# Patient Record
Sex: Female | Born: 1950 | Race: White | Hispanic: No | Marital: Single | Smoking: Former smoker
Health system: Southern US, Community
[De-identification: ages and names within clinical notes are randomized; demographics above are authoritative.]

## PROBLEM LIST (undated history)

## (undated) DIAGNOSIS — Z720 Tobacco use: Secondary | ICD-10-CM

## (undated) DIAGNOSIS — F329 Major depressive disorder, single episode, unspecified: Secondary | ICD-10-CM

## (undated) DIAGNOSIS — E785 Hyperlipidemia, unspecified: Secondary | ICD-10-CM

## (undated) DIAGNOSIS — F32A Depression, unspecified: Secondary | ICD-10-CM

## (undated) DIAGNOSIS — Z8719 Personal history of other diseases of the digestive system: Secondary | ICD-10-CM

## (undated) DIAGNOSIS — I10 Essential (primary) hypertension: Secondary | ICD-10-CM

## (undated) DIAGNOSIS — I639 Cerebral infarction, unspecified: Secondary | ICD-10-CM

## (undated) DIAGNOSIS — G43909 Migraine, unspecified, not intractable, without status migrainosus: Secondary | ICD-10-CM

## (undated) HISTORY — DX: Personal history of other diseases of the digestive system: Z87.19

## (undated) HISTORY — PX: CHOLECYSTECTOMY: SHX55

## (undated) HISTORY — DX: Migraine, unspecified, not intractable, without status migrainosus: G43.909

## (undated) HISTORY — DX: Essential (primary) hypertension: I10

## (undated) HISTORY — PX: PARTIAL HYSTERECTOMY: SHX80

## (undated) HISTORY — PX: COLONOSCOPY: SHX174

## (undated) HISTORY — DX: Hyperlipidemia, unspecified: E78.5

## (undated) HISTORY — DX: Tobacco use: Z72.0

---

## 2003-12-15 HISTORY — PX: US ECHOCARDIOGRAPHY: HXRAD669

## 2005-07-09 ENCOUNTER — Encounter (INDEPENDENT_AMBULATORY_CARE_PROVIDER_SITE_OTHER): Payer: Self-pay | Admitting: Specialist

## 2005-07-10 ENCOUNTER — Ambulatory Visit (HOSPITAL_COMMUNITY): Admission: RE | Admit: 2005-07-10 | Discharge: 2005-07-10 | Payer: Self-pay | Admitting: General Surgery

## 2007-08-17 ENCOUNTER — Encounter: Payer: Self-pay | Admitting: Pulmonary Disease

## 2007-08-25 ENCOUNTER — Ambulatory Visit: Payer: Self-pay | Admitting: Pulmonary Disease

## 2007-09-07 ENCOUNTER — Telehealth: Payer: Self-pay | Admitting: Pulmonary Disease

## 2007-09-09 DIAGNOSIS — J209 Acute bronchitis, unspecified: Secondary | ICD-10-CM | POA: Insufficient documentation

## 2007-09-09 DIAGNOSIS — R079 Chest pain, unspecified: Secondary | ICD-10-CM | POA: Insufficient documentation

## 2007-09-09 DIAGNOSIS — R0602 Shortness of breath: Secondary | ICD-10-CM | POA: Insufficient documentation

## 2007-09-13 ENCOUNTER — Telehealth: Payer: Self-pay | Admitting: Pulmonary Disease

## 2009-08-10 HISTORY — PX: CARDIOVASCULAR STRESS TEST: SHX262

## 2010-02-01 ENCOUNTER — Ambulatory Visit: Payer: Self-pay | Admitting: Cardiology

## 2010-07-08 ENCOUNTER — Ambulatory Visit: Payer: Self-pay | Admitting: Cardiology

## 2010-09-16 ENCOUNTER — Other Ambulatory Visit: Payer: Self-pay | Admitting: Cardiology

## 2010-09-17 ENCOUNTER — Other Ambulatory Visit: Payer: Self-pay | Admitting: *Deleted

## 2010-09-17 MED ORDER — FLUTICASONE PROPIONATE 50 MCG/ACT NA SUSP: 2.0000 | Freq: Every day | NASAL | Status: DC

## 2010-10-25 NOTE — Op Note (Signed)
Patricia Norman, Patricia Norman            ACCOUNT NO.:  [id number]   MEDICAL RECORD NO.:  [id number]          PATIENT TYPE:  AMB   LOCATION:  DAY                          FACILITY:  [facility]   PHYSICIAN:  Sharlet Salina T. Hoxworth, M.D.DATE OF BIRTH:  [birth year]   DATE OF PROCEDURE:  07/09/2005  DATE OF DISCHARGE:                                 OPERATIVE REPORT   PREOPERATIVE DIAGNOSIS:  Cholelithiasis and cholecystitis.   POSTOPERATIVE DIAGNOSIS:  Cholelithiasis and cholecystitis.   SURGICAL PROCEDURE:  Laparoscopic cholecystectomy with intraoperative  cholangiogram.   SURGEON:  Dr. Johna Sheriff   ASSISTANT:  Dr. Jerelene Redden   ANESTHESIA:  General.   BRIEF HISTORY:  Patricia Norman is a 60 year old female with a 2-3 week history  of a persistent waxing waning right upper quadrant abdominal pain, nausea,  and occasional vomiting.  Earlier today, she had a gallbladder ultrasound  performed which showed a thickened gallbladder wall and multiple gallstones.  She has moderately elevated LFTs as well.  The common bile duct appears  normal on ultrasound.  I have recommended proceeding with laparoscopic  cholecystectomy with intraoperative cholangiogram.  The nature of the  procedure, its indications, risks of bleeding, infection, bile leak, bile  duct injury, cardiorespiratory complications were discussed understood.  She  is now brought to the operating room for this procedure.   DESCRIPTION OF OPERATION:  The patient brought to the operating room and  placed in a supine position on the operating table, and general orotracheal  anesthesia was induced.  She received preoperative antibiotics.  The abdomen  was widely sterilely prepped and draped.  Correct patient and procedure were  verified.  An open Roseanne Reno technique was used through a 1 cm infraumbilical  incision through a mattress sutures of 0 Vicryl and pneumoperitoneum  established.  Under direct vision, a 10 mm trocar was placed in the  subxiphoid area and two 5 mm trocars, 1 in the right subcostal margin.  The  gallbladder was thickened and somewhat edematous.  The fundus was grasped  and elevated up over the liver, the infundibulum retracted inferolaterally.  Fibrofatty tissue was stripped off the neck of the gallbladder toward the  porta hepatis, and peritoneum anterior and posterior to Calot's triangle was  incised.  The distal gallbladder and Calot's triangle was thoroughly  dissected.  The cystic duct gallbladder junction was dissected 360 degrees  and the cystic artery identified.  When the anatomy was clear, the cystic  duct was clipped at the gallbladder junction and operative cholangiogram was  obtained through the cystic duct.  This showed good filling of normal-sized  common bile duct and intrahepatic ducts with free flow into the duodenum and  no filling defects.  Following this, the cholangiocath was removed, and the  cystic duct was triply clipped proximally and divided.  The cystic artery  was double clipped proximally, clipped distally, and divided.  The  gallbladder was then dissected free from its bed using hook cautery and  removed through the umbilicus.  Complete hemostasis was ensured in the  operative site.  Trocars were removed under direct vision, all CO2  evacuated.  The mattress suture  was secured at the umbilicus.  Skin incisions were closed with interrupted  subcuticular 4-0 Monocryl and Steri-Strips.  Sponge, needle, and instrument  counts were correct. Dry sterile dressings were applied and the patient  taken to the recovery room in good condition.      Lorne Skeens. Hoxworth, M.D.  Electronically Signed     BTH/MEDQ  D:  07/09/2005  T:  07/10/2005  Job:  [id number]

## 2010-12-25 ENCOUNTER — Other Ambulatory Visit: Payer: Self-pay | Admitting: *Deleted

## 2010-12-25 DIAGNOSIS — F419 Anxiety disorder, unspecified: Secondary | ICD-10-CM

## 2010-12-25 MED ORDER — ALPRAZOLAM 0.5 MG PO TABS: 0.5000 mg | ORAL_TABLET | Freq: Every day | ORAL | Status: AC | PRN

## 2010-12-25 NOTE — Telephone Encounter (Signed)
Faxed signed RX

## 2011-02-07 ENCOUNTER — Encounter: Payer: Self-pay | Admitting: Cardiology

## 2011-02-18 ENCOUNTER — Other Ambulatory Visit: Payer: Self-pay | Admitting: Cardiology

## 2011-02-18 DIAGNOSIS — E785 Hyperlipidemia, unspecified: Secondary | ICD-10-CM

## 2011-02-18 DIAGNOSIS — I10 Essential (primary) hypertension: Secondary | ICD-10-CM

## 2011-02-21 ENCOUNTER — Encounter: Payer: Self-pay | Admitting: Cardiology

## 2011-02-21 ENCOUNTER — Other Ambulatory Visit (INDEPENDENT_AMBULATORY_CARE_PROVIDER_SITE_OTHER): Payer: BC Managed Care – PPO | Admitting: *Deleted

## 2011-02-21 ENCOUNTER — Ambulatory Visit (INDEPENDENT_AMBULATORY_CARE_PROVIDER_SITE_OTHER): Payer: BC Managed Care – PPO | Admitting: Cardiology

## 2011-02-21 VITALS — BP 130/70 | HR 76 | Ht <= 58 in | Wt 123.6 lb

## 2011-02-21 DIAGNOSIS — M3500 Sicca syndrome, unspecified: Secondary | ICD-10-CM

## 2011-02-21 DIAGNOSIS — Z7189 Other specified counseling: Secondary | ICD-10-CM

## 2011-02-21 DIAGNOSIS — I119 Hypertensive heart disease without heart failure: Secondary | ICD-10-CM | POA: Insufficient documentation

## 2011-02-21 DIAGNOSIS — K3189 Other diseases of stomach and duodenum: Secondary | ICD-10-CM

## 2011-02-21 DIAGNOSIS — E785 Hyperlipidemia, unspecified: Secondary | ICD-10-CM

## 2011-02-21 DIAGNOSIS — R1013 Epigastric pain: Secondary | ICD-10-CM

## 2011-02-21 DIAGNOSIS — I10 Essential (primary) hypertension: Secondary | ICD-10-CM

## 2011-02-21 DIAGNOSIS — E7800 Pure hypercholesterolemia, unspecified: Secondary | ICD-10-CM

## 2011-02-21 DIAGNOSIS — Z716 Tobacco abuse counseling: Secondary | ICD-10-CM

## 2011-02-21 LAB — BASIC METABOLIC PANEL
BUN: 12 mg/dL (ref 6–23)
CO2: 33 mEq/L — ABNORMAL HIGH (ref 19–32)
Calcium: 9.6 mg/dL (ref 8.4–10.5)
Chloride: 101 mEq/L (ref 96–112)
Creatinine, Ser: 0.8 mg/dL (ref 0.4–1.2)

## 2011-02-21 LAB — LIPID PANEL
Cholesterol: 184 mg/dL (ref 0–200)
Total CHOL/HDL Ratio: 3

## 2011-02-21 LAB — HEPATIC FUNCTION PANEL
ALT: 30 U/L (ref 0–35)
AST: 33 U/L (ref 0–37)
Alkaline Phosphatase: 56 U/L (ref 39–117)
Total Bilirubin: 0.7 mg/dL (ref 0.3–1.2)

## 2011-02-21 NOTE — Assessment & Plan Note (Signed)
The patient has a history of sicca syndrome.  Her ophthalmologist has placed her on vitamin E and vitamin A and fish oil twice a day for dry eyes.  Patient is also bothered with dry mouth.  She is postmenopausal.

## 2011-02-21 NOTE — Progress Notes (Signed)
Patricia Norman Date of Birth:  05-07-1951 [facility] [address]   [address] [place], [place]  [address] [phone]           Fax   [phone]  History of Present Illness: This pleasant 60 year old woman is seen for a scheduled six-month followup office visit.  He has a past history of essential hypertension and a past history of sicca syndrome and postmenopausal state.  She has ongoing cigarette usage and she has a history of dyslipidemia.  Since we last saw her she has seen her eye doctor who has started her on vitamin E and vitamin A. To try to help her dry eyes.  She is also on fish oil twice a day.  The patient does not have any history of ischemic heart disease although she has risk factors including family history and ongoing tobacco use, hypertension, and hyperlipidemia.  She had a one date treadmill Cardiolite stress test on 08/10/09 which showed normal perfusion and an ejection fraction of 67% patient had an echocardiogram 12/15/03 showed normal left ventricular systolic and diastolic function and mild mitral regurgitation and normal left atrial size  Current Outpatient Prescriptions  Medication Sig Dispense Refill  . ALPRAZolam (XANAX) 0.5 MG tablet Take 0.5 mg by mouth at bedtime as needed.        Marland Kitchen amLODipine (NORVASC) 10 MG tablet Take 10 mg by mouth daily.        Marland Kitchen aspirin-acetaminophen-caffeine (EXCEDRIN MIGRAINE) 250-250-65 MG per tablet Take 1 tablet by mouth every 6 (six) hours as needed.        . Calcium Carbonate (CALCIUM 500 PO) Take 1,000 mg by mouth daily.        . cyclobenzaprine (FLEXERIL) 10 MG tablet Take 10 mg by mouth 3 (three) times daily as needed.        . diphenhydrAMINE (BENADRYL ALLERGY) 25 mg capsule Take 25 mg by mouth every 6 (six) hours as needed.        Marland Kitchen estradiol (ESTRACE) 1 MG tablet Take 0.5 mg by mouth daily.        . fish oil-omega-3 fatty acids 1000 MG capsule Take 1 g by mouth daily.        . fluticasone  (FLONASE) 50 MCG/ACT nasal spray USE 1 SPRAY IN EACH NOSTRIL DAILY  1 Act  5  . hydrochlorothiazide (,MICROZIDE/HYDRODIURIL,) 12.5 MG capsule Take 12.5 mg by mouth daily.        Marland Kitchen loratadine (CLARITIN) 10 MG tablet Take 10 mg by mouth as needed.        . metoprolol succinate (TOPROL-XL) 25 MG 24 hr tablet Take 25 mg by mouth daily.        . Multiple Vitamin (MULTIVITAMIN) tablet Take 1 tablet by mouth daily.        Marland Kitchen omeprazole (PRILOSEC) 20 MG capsule Take 20 mg by mouth 2 (two) times daily.        Marland Kitchen oxybutynin (DITROPAN-XL) 10 MG 24 hr tablet Take 10 mg by mouth daily.        . sertraline (ZOLOFT) 50 MG tablet Take 50 mg by mouth daily.        . simvastatin (ZOCOR) 20 MG tablet Take 20 mg by mouth at bedtime.        Marland Kitchen VITAMIN E PO Take by mouth daily.          Allergies  Allergen Reactions  . Ace Inhibitors   . Chantix (Varenicline Tartrate)   .  Codeine   . Latex   . Spironolactone     Patient Active Problem List  Diagnoses  . ASTHMATIC BRONCHITIS, ACUTE  . DYSPNEA  . CHEST PAIN UNSPECIFIED    History  Smoking status  . Current Everyday Smoker -- 0.5 packs/day  Smokeless tobacco  . Not on file    History  Alcohol Use No    Family History  Problem Relation Age of Onset  . Hypertension Mother   . Angina Father     Review of Systems: Constitutional: no fever chills diaphoresis or fatigue or change in weight.  Head and neck: no hearing loss, no epistaxis, no photophobia or visual disturbance. Respiratory: No cough, shortness of breath or wheezing. Cardiovascular: No chest pain peripheral edema, palpitations. Gastrointestinal: No abdominal distention, no abdominal pain, no change in bowel habits hematochezia or melena. Genitourinary: No dysuria, no frequency, no urgency, no nocturia. Musculoskeletal:No arthralgias, no back pain, no gait disturbance or myalgias. Neurological: No dizziness, no headaches, no numbness, no seizures, no syncope, no weakness, no  tremors. Hematologic: No lymphadenopathy, no easy bruising. Psychiatric: No confusion, no hallucinations, no sleep disturbance.    Physical Exam: Filed Vitals:   02/21/11 0854  BP: 130/70  Pulse: 76  The general appearance reveals a well-developed well-nourished middle-aged woman in no distress.The head and neck exam reveals pupils equal and reactive.  Extraocular movements are full.  There is no scleral icterus.  The mouth and pharynx are normal.  The neck is supple.  The carotids reveal no bruits.  The jugular venous pressure is normal.  The  thyroid is not enlarged.  There is no lymphadenopathy.  The chest is clear to percussion and auscultation.  There are no rales or rhonchi.  Expansion of the chest is symmetrical.  The precordium is quiet.  The first heart sound is normal.  The second heart sound is physiologically split.  There is no murmur gallop rub or click.  There is no abnormal lift or heave.  The abdomen is soft and nontender.  The bowel sounds are normal.  The liver and spleen are not enlarged.  There are no abdominal masses.  There are no abdominal bruits.  Extremities reveal good pedal pulses.  There is no phlebitis or edema.  There is no cyanosis or clubbing.  Strength is normal and symmetrical in all extremities.  There is no lateralizing weakness.  There are no sensory deficits.  The skin is warm and dry.  There is no rash.     Assessment / Plan: Continue same medication.  Work harder on quitting smoking.  Recheck in 6 months for followup office visit and fasting lab work

## 2011-02-21 NOTE — Assessment & Plan Note (Signed)
The patient has not been having any symptoms referable to her blood pressure.  Blood pressure has been adequately controlled recently.  No headaches or dizzy spells or palpitations.

## 2011-02-21 NOTE — Assessment & Plan Note (Signed)
The patient continues to smoke between 5 and 7 cigarettes a day.  She's not having any productive cough or pulmonary symptoms at the present time.  We counseled her about the importance of stopping smoking.  Her boyfriend is also in the process of quitting.

## 2011-02-21 NOTE — Assessment & Plan Note (Signed)
The patient has a history of dyslipidemia.  She is on simvastatin 20 mg daily.  She is not having any side effects from his statin therapy.  Blood work is pending.  Continue on same medication and low-cholesterol diet.

## 2011-03-03 ENCOUNTER — Other Ambulatory Visit: Payer: Self-pay | Admitting: *Deleted

## 2011-03-03 DIAGNOSIS — I119 Hypertensive heart disease without heart failure: Secondary | ICD-10-CM

## 2011-03-03 MED ORDER — AMLODIPINE BESYLATE 10 MG PO TABS: 10.0000 mg | ORAL_TABLET | Freq: Every day | ORAL | Status: DC

## 2011-03-03 NOTE — Telephone Encounter (Signed)
Refilled meds per fax request.  

## 2011-03-04 ENCOUNTER — Telehealth: Payer: Self-pay | Admitting: *Deleted

## 2011-03-04 NOTE — Progress Notes (Signed)
Mailed copy, fiance in hospital

## 2011-03-04 NOTE — Telephone Encounter (Signed)
Mailed labs, fiance in hospital

## 2011-03-04 NOTE — Telephone Encounter (Signed)
Message copied by Burnell Blanks on Tue Mar 04, 2011 10:18 AM ------      Message from: Cassell Clement      Created: Sat Feb 22, 2011  4:00 PM       Please report.The liver function studies are normal.The LDL cholesterol is slightly high at 111.  Triglycerides are normal.  The blood sugar is borderline high at 100 and the potassium is low at 3.4.  Increase intake of banana and potassium rich foods.  Continue same medication.

## 2011-03-06 ENCOUNTER — Other Ambulatory Visit: Payer: Self-pay | Admitting: *Deleted

## 2011-03-06 DIAGNOSIS — I119 Hypertensive heart disease without heart failure: Secondary | ICD-10-CM

## 2011-03-06 MED ORDER — AMLODIPINE BESYLATE 10 MG PO TABS: 10.0000 mg | ORAL_TABLET | Freq: Every day | ORAL | Status: DC

## 2011-03-06 MED ORDER — METOPROLOL SUCCINATE ER 25 MG PO TB24: 25.0000 mg | ORAL_TABLET | Freq: Every day | ORAL | Status: DC

## 2011-03-06 NOTE — Telephone Encounter (Signed)
Refilled metoprolol and amlodipine

## 2011-04-10 ENCOUNTER — Other Ambulatory Visit: Payer: Self-pay | Admitting: *Deleted

## 2011-04-10 MED ORDER — SIMVASTATIN 20 MG PO TABS: 20.0000 mg | ORAL_TABLET | Freq: Every day | ORAL | Status: DC

## 2011-07-03 ENCOUNTER — Other Ambulatory Visit: Payer: Self-pay | Admitting: *Deleted

## 2011-07-03 MED ORDER — FLUTICASONE PROPIONATE 50 MCG/ACT NA SUSP: 1.0000 | Freq: Every day | NASAL | Status: DC

## 2011-07-03 NOTE — Telephone Encounter (Signed)
Refilled fluticasone.  

## 2011-07-28 ENCOUNTER — Other Ambulatory Visit: Payer: Self-pay | Admitting: *Deleted

## 2011-07-28 DIAGNOSIS — F419 Anxiety disorder, unspecified: Secondary | ICD-10-CM

## 2011-07-28 MED ORDER — ALPRAZOLAM 0.5 MG PO TABS: 0.5000 mg | ORAL_TABLET | Freq: Every evening | ORAL | Status: DC | PRN

## 2011-07-28 NOTE — Telephone Encounter (Signed)
Refill alprazolam one time,needs appt.

## 2011-09-04 ENCOUNTER — Other Ambulatory Visit: Payer: Self-pay | Admitting: *Deleted

## 2011-09-04 ENCOUNTER — Other Ambulatory Visit: Payer: Self-pay | Admitting: Cardiology

## 2011-09-04 ENCOUNTER — Telehealth: Payer: Self-pay | Admitting: Cardiology

## 2011-09-04 MED ORDER — POTASSIUM CHLORIDE ER 10 MEQ PO CPCR: ORAL_CAPSULE | ORAL | Status: DC

## 2011-09-04 NOTE — Telephone Encounter (Signed)
Pt needs kher potassium chloride called into [facility] in [place]

## 2011-09-04 NOTE — Telephone Encounter (Signed)
Refilled potassium,needs appointment

## 2011-09-09 NOTE — Telephone Encounter (Signed)
IT WAS REFILLED ON 09/04/11, AND PATIENT NEEDS TO SCHEDULE AN APPT FOR FURTHER REFILLS

## 2011-10-07 ENCOUNTER — Ambulatory Visit: Payer: BC Managed Care – PPO | Admitting: Cardiology

## 2011-10-24 ENCOUNTER — Other Ambulatory Visit: Payer: Self-pay | Admitting: Cardiology

## 2011-10-29 ENCOUNTER — Telehealth: Payer: Self-pay | Admitting: *Deleted

## 2011-10-29 MED ORDER — CYCLOBENZAPRINE HCL 10 MG PO TABS: 10.0000 mg | ORAL_TABLET | Freq: Three times a day (TID) | ORAL | Status: DC | PRN

## 2011-10-29 NOTE — Telephone Encounter (Signed)
Patricia Norman in hospice home and has about 2 weeks. Requested refill on Flexeril. Patient states she will call after things settle down.

## 2011-11-07 ENCOUNTER — Ambulatory Visit: Payer: BC Managed Care – PPO | Admitting: Cardiology

## 2011-12-17 ENCOUNTER — Telehealth: Payer: Self-pay | Admitting: Cardiology

## 2011-12-17 ENCOUNTER — Other Ambulatory Visit: Payer: Self-pay | Admitting: *Deleted

## 2011-12-17 DIAGNOSIS — I119 Hypertensive heart disease without heart failure: Secondary | ICD-10-CM

## 2011-12-17 MED ORDER — AMLODIPINE BESYLATE 10 MG PO TABS: 10.0000 mg | ORAL_TABLET | Freq: Every day | ORAL | Status: DC

## 2011-12-17 NOTE — Telephone Encounter (Signed)
Amlodipine 90 day supply refill needed asap, pt out, [facility] fax [facility]

## 2011-12-17 NOTE — Telephone Encounter (Signed)
90day supply sent.

## 2012-01-22 ENCOUNTER — Other Ambulatory Visit: Payer: Self-pay | Admitting: Cardiology

## 2012-01-22 DIAGNOSIS — F419 Anxiety disorder, unspecified: Secondary | ICD-10-CM

## 2012-01-22 DIAGNOSIS — E7800 Pure hypercholesterolemia, unspecified: Secondary | ICD-10-CM

## 2012-01-22 DIAGNOSIS — E876 Hypokalemia: Secondary | ICD-10-CM

## 2012-01-22 NOTE — Telephone Encounter (Signed)
New problem:  Patient calling, need lab work set up in the system prior to appt in sept.

## 2012-01-22 NOTE — Telephone Encounter (Signed)
Patient called to make sure she  is schedule for an O/V on 02/10/12 at 9:30 AM. Pt wants to make sure she also will have Blood work done the same day, because she is coming from [place]. Pt is aware that in the appointment note it said that pt will have labs with O/V. I  will send this message to [facility] Dr. Patty Sermons nurse desktop.

## 2012-01-23 MED ORDER — POTASSIUM CHLORIDE ER 10 MEQ PO CPCR: 10.0000 meq | ORAL_CAPSULE | ORAL | Status: DC

## 2012-01-23 MED ORDER — ALPRAZOLAM 0.5 MG PO TABS: 0.5000 mg | ORAL_TABLET | Freq: Every evening | ORAL | Status: DC | PRN

## 2012-01-23 NOTE — Telephone Encounter (Signed)
Scheduled labs.  Patient also requesting refills on her Xanax and K+, called to pharmacy

## 2012-02-10 ENCOUNTER — Encounter: Payer: Self-pay | Admitting: Cardiology

## 2012-02-10 ENCOUNTER — Ambulatory Visit (INDEPENDENT_AMBULATORY_CARE_PROVIDER_SITE_OTHER): Payer: BC Managed Care – PPO | Admitting: Cardiology

## 2012-02-10 VITALS — BP 137/82 | HR 72 | Ht <= 58 in | Wt 112.8 lb

## 2012-02-10 DIAGNOSIS — F411 Generalized anxiety disorder: Secondary | ICD-10-CM

## 2012-02-10 DIAGNOSIS — E7800 Pure hypercholesterolemia, unspecified: Secondary | ICD-10-CM

## 2012-02-10 DIAGNOSIS — M3500 Sicca syndrome, unspecified: Secondary | ICD-10-CM

## 2012-02-10 DIAGNOSIS — R0602 Shortness of breath: Secondary | ICD-10-CM

## 2012-02-10 DIAGNOSIS — E785 Hyperlipidemia, unspecified: Secondary | ICD-10-CM

## 2012-02-10 DIAGNOSIS — F419 Anxiety disorder, unspecified: Secondary | ICD-10-CM

## 2012-02-10 DIAGNOSIS — R079 Chest pain, unspecified: Secondary | ICD-10-CM

## 2012-02-10 LAB — HEPATIC FUNCTION PANEL
ALT: 19 U/L (ref 0–35)
Bilirubin, Direct: 0.1 mg/dL (ref 0.0–0.3)
Total Bilirubin: 0.4 mg/dL (ref 0.3–1.2)

## 2012-02-10 LAB — BASIC METABOLIC PANEL
BUN: 15 mg/dL (ref 6–23)
Creatinine, Ser: 0.8 mg/dL (ref 0.4–1.2)
GFR: 83.43 mL/min (ref >60.00)

## 2012-02-10 LAB — LIPID PANEL
Cholesterol: 160 mg/dL (ref 0–200)
LDL Cholesterol: 87 mg/dL (ref 0–99)
Total CHOL/HDL Ratio: 3

## 2012-02-10 MED ORDER — PANTOPRAZOLE SODIUM 40 MG PO TBEC: 40.0000 mg | DELAYED_RELEASE_TABLET | Freq: Every day | ORAL | Status: DC

## 2012-02-10 MED ORDER — CEVIMELINE HCL 30 MG PO CAPS: 30.0000 mg | ORAL_CAPSULE | Freq: Three times a day (TID) | ORAL | Status: DC

## 2012-02-10 MED ORDER — ALPRAZOLAM 0.5 MG PO TABS: 0.5000 mg | ORAL_TABLET | Freq: Two times a day (BID) | ORAL | Status: DC | PRN

## 2012-02-10 NOTE — Progress Notes (Signed)
Quick Note:  Please report to patient. The recent labs are stable. Continue same medication and careful diet.Potassium too low but she said at OV she had run out or forgot to take K tabs? CSD ______

## 2012-02-10 NOTE — Progress Notes (Signed)
Patricia Norman Date of Birth:  Mar 18, 1951 [facility] [address] [place], [place]  [address] [phone]         Fax   [phone]  History of Present Illness: This pleasant 61 year old woman is seen for a scheduled six-month followup office visit. He has a past history of essential hypertension and a past history of sicca syndrome and postmenopausal state. She has ongoing cigarette usage and she has a history of dyslipidemia. Since we last saw her she has seen her eye doctor who has started her on vitamin E and vitamin A. To try to help her dry eyes. She is also on fish oil twice a day. The patient does not have any history of ischemic heart disease although she has risk factors including family history and ongoing tobacco use, hypertension, and hyperlipidemia. She had a one date treadmill Cardiolite stress test on 08/10/09 which showed normal perfusion and an ejection fraction of 67% patient had an echocardiogram 12/15/03 showed normal left ventricular systolic and diastolic function and mild mitral regurgitation and normal left atrial size.  Since we last saw her she has been under a lot of stress.  Her common-law husband died of lung cancer with brain metastases in November 18, 2011.  This has been very stressful for her.  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  She now splits her time between living in her home in [place] and visiting her daughter who lives in [place].   Current Outpatient Prescriptions  Medication Sig Dispense Refill  . ALPRAZolam (XANAX) 0.5 MG tablet Take 1 tablet (0.5 mg total) by mouth 2 (two) times daily as needed.  180 tablet  1  . amLODipine (NORVASC) 10 MG tablet Take 1 tablet (10 mg total) by mouth daily.  90 tablet  3  . aspirin-acetaminophen-caffeine (EXCEDRIN MIGRAINE) 250-250-65 MG per tablet Take 1 tablet by mouth every 6 (six) hours as  needed.        . Calcium Carbonate (CALCIUM 500 PO) Take 1,000 mg by mouth daily.        . cyclobenzaprine (FLEXERIL) 10 MG tablet Take 1 tablet (10 mg total) by mouth 3 (three) times daily as needed.  90 tablet  0  . diphenhydrAMINE (BENADRYL ALLERGY) 25 mg capsule Take 25 mg by mouth every 6 (six) hours as needed.        Marland Kitchen estradiol (ESTRACE) 1 MG tablet Take 0.5 mg by mouth daily.        . fish oil-omega-3 fatty acids 1000 MG capsule Take 1 g by mouth daily.        . fluticasone (FLONASE) 50 MCG/ACT nasal spray Place 1 spray into the nose daily.  16 g  2  . hydrochlorothiazide (,MICROZIDE/HYDRODIURIL,) 12.5 MG capsule Take 12.5 mg by mouth daily.        Marland Kitchen loratadine (CLARITIN) 10 MG tablet Take 10 mg by mouth as needed.        . metoprolol succinate (TOPROL-XL) 25 MG 24 hr tablet Take 1 tablet (25 mg total) by mouth daily.  90 tablet  3  . Multiple Vitamin (MULTIVITAMIN) tablet Take 1 tablet by mouth daily.        Marland Kitchen omeprazole (PRILOSEC) 20 MG capsule Take 20 mg by mouth 2 (two) times daily.       Marland Kitchen oxybutynin (DITROPAN-XL) 10 MG 24 hr tablet Take 10  mg by mouth daily.        . potassium chloride (MICRO-K) 10 MEQ CR capsule Take 1 capsule (10 mEq total) by mouth as directed. Take 2 caps 3 times a day  540 capsule  1  . sertraline (ZOLOFT) 50 MG tablet TAKE 1 TABLET BY MOUTH EVERY DAY  90 tablet  0  . simvastatin (ZOCOR) 20 MG tablet Take 1 tablet (20 mg total) by mouth at bedtime.  90 tablet  3  . VITAMIN E PO Take by mouth daily.        . cevimeline (EVOXAC) 30 MG capsule Take 1 capsule (30 mg total) by mouth 3 (three) times daily.  90 capsule  1  . pantoprazole (PROTONIX) 40 MG tablet Take 1 tablet (40 mg total) by mouth daily.  90 tablet  3    Allergies  Allergen Reactions  . Ace Inhibitors   . Chantix (Varenicline Tartrate)   . Codeine   . Latex   . Spironolactone     Patient Active Problem List  Diagnosis  . ASTHMATIC BRONCHITIS, ACUTE  . DYSPNEA  . CHEST PAIN UNSPECIFIED    . Sicca syndrome  . Tobacco abuse counseling  . Benign hypertensive heart disease without heart failure  . Dyslipidemia    History  Smoking status  . Current Everyday Smoker -- 0.5 packs/day  Smokeless tobacco  . Not on file    History  Alcohol Use No    Family History  Problem Relation Age of Onset  . Hypertension Mother   . Angina Father     Review of Systems: Constitutional: no fever chills diaphoresis or fatigue or change in weight.  Head and neck: no hearing loss, no epistaxis, no photophobia or visual disturbance. Respiratory: No cough, shortness of breath or wheezing. Cardiovascular: No chest pain peripheral edema, palpitations. Gastrointestinal: No abdominal distention, no abdominal pain, no change in bowel habits hematochezia or melena. Genitourinary: No dysuria, no frequency, no urgency, no nocturia. Musculoskeletal:No arthralgias, no back pain, no gait disturbance or myalgias. Neurological: No dizziness, no headaches, no numbness, no seizures, no syncope, no weakness, no tremors. Hematologic: No lymphadenopathy, no easy bruising. Psychiatric: No confusion, no hallucinations, no sleep disturbance.    Physical Exam: Filed Vitals:   02/10/12 1010  BP: 137/82  Pulse: 72   the general appearance reveals a well-developed well-nourished woman in no distress.  She has lost 11 pounds since we last saw her.The head and neck exam reveals pupils equal and reactive.  Extraocular movements are full.  There is no scleral icterus.  The mouth and pharynx are normal.  The neck is supple.  The carotids reveal no bruits.  The jugular venous pressure is normal.  The  thyroid is not enlarged.  There is no lymphadenopathy.  The chest is clear to percussion and auscultation.  There are no rales or rhonchi.  Expansion of the chest is symmetrical.  The precordium is quiet.  The first heart sound is normal.  The second heart sound is physiologically split.  There is no murmur gallop rub  or click.  There is no abnormal lift or heave.  The abdomen is soft and nontender.  The bowel sounds are normal.  The liver and spleen are not enlarged.  There are no abdominal masses.  There are no abdominal bruits.  Extremities reveal good pedal pulses.  There is no phlebitis or edema.  There is no cyanosis or clubbing.  Strength is normal and symmetrical in all extremities.  There is no lateralizing weakness.  There are no sensory deficits.  The skin is warm and dry.  There is no rash.  There is a darkly pigmented small lesion on her left lower anterior chest and she will have her dermatologist check this.     Assessment / Plan: The patient is to continue same medication and we are adding Evoxac for sicca syndrome.  Recheck in 6 months for followup office visit lipid panel hepatic function panel and basal metabolic panel.

## 2012-02-10 NOTE — Assessment & Plan Note (Signed)
The patient has a history of hypercholesterolemia and is on simvastatin.  We're checking lab work today

## 2012-02-10 NOTE — Patient Instructions (Addendum)
Increase your Xanax to twice a day as needed  Start Protonix 40 mg daily  Start Evoxac 30 mg three times a day, call and let us know how this is working  Your physician wants you to follow-up in: 6 months with fasting labs (lp/bmet/hfp)  You will receive a reminder letter in the mail two months in advance. If you don't receive a letter, please call our office to schedule the follow-up appointment.   Will obtain labs today and call you with the results (lp/bmet/hfp)  .

## 2012-02-10 NOTE — Assessment & Plan Note (Signed)
The patient has not been having any recent chest pain.  She has had dyspepsia and is to start back on her Protonix which has helped her in the past.

## 2012-02-10 NOTE — Assessment & Plan Note (Signed)
The patient is having a terrible time with dry mouth.  She is not taking any antihistamines.  We talked about possible referral to rheumatologist but she did not want to do that right now.  We will give her a trial of Evoxac 30 mg 3 times a day.  We will give her a 2 week trial and if it works she will get it refilled for one month at a time.

## 2012-02-10 NOTE — Assessment & Plan Note (Signed)
Because of the stress over the past year the patient has not been able to quit smoking yet

## 2012-02-11 ENCOUNTER — Other Ambulatory Visit: Payer: BC Managed Care – PPO

## 2012-02-17 ENCOUNTER — Telehealth: Payer: Self-pay | Admitting: Cardiology

## 2012-02-17 NOTE — Telephone Encounter (Signed)
Advised patient of lab results  

## 2012-02-17 NOTE — Telephone Encounter (Signed)
rtn someone calls

## 2012-02-17 NOTE — Telephone Encounter (Signed)
Message copied by Burnell Blanks on Tue Feb 17, 2012  3:42 PM ------      Message from: Cassell Clement      Created: Tue Feb 10, 2012  9:11 PM       Please report to patient.  The recent labs are stable. Continue same medication and careful diet.Potassium too low but she said at OV she had run out or forgot to take K tabs?  CSD

## 2012-03-22 ENCOUNTER — Other Ambulatory Visit: Payer: Self-pay | Admitting: Cardiology

## 2012-03-22 ENCOUNTER — Telehealth: Payer: Self-pay | Admitting: Cardiology

## 2012-03-22 DIAGNOSIS — F329 Major depressive disorder, single episode, unspecified: Secondary | ICD-10-CM

## 2012-03-22 DIAGNOSIS — F32A Depression, unspecified: Secondary | ICD-10-CM

## 2012-03-22 DIAGNOSIS — T148XXA Other injury of unspecified body region, initial encounter: Secondary | ICD-10-CM

## 2012-03-22 DIAGNOSIS — I119 Hypertensive heart disease without heart failure: Secondary | ICD-10-CM

## 2012-03-22 NOTE — Telephone Encounter (Signed)
Mailed copy of labs as requested, advised patient

## 2012-03-22 NOTE — Telephone Encounter (Signed)
New Problem:    Patient called in wanting a copy of her blood work from her last visit to enroll in a medical plan.  Please call back.

## 2012-04-06 ENCOUNTER — Telehealth: Payer: Self-pay | Admitting: Cardiology

## 2012-04-06 NOTE — Telephone Encounter (Signed)
Pt calling re dr Patty Sermons seeing her daughter for high blood pressure, she has not seen him before

## 2012-04-07 NOTE — Telephone Encounter (Signed)
Left message to call back  

## 2012-04-30 ENCOUNTER — Other Ambulatory Visit: Payer: Self-pay | Admitting: *Deleted

## 2012-04-30 DIAGNOSIS — T148XXA Other injury of unspecified body region, initial encounter: Secondary | ICD-10-CM

## 2012-04-30 MED ORDER — CYCLOBENZAPRINE HCL 10 MG PO TABS: 10.0000 mg | ORAL_TABLET | Freq: Three times a day (TID) | ORAL | Status: DC | PRN

## 2012-05-25 ENCOUNTER — Other Ambulatory Visit: Payer: Self-pay | Admitting: *Deleted

## 2012-05-25 MED ORDER — FLUTICASONE PROPIONATE 50 MCG/ACT NA SUSP: 1.0000 | Freq: Every day | NASAL | Status: DC

## 2012-06-03 ENCOUNTER — Other Ambulatory Visit: Payer: Self-pay

## 2012-06-03 NOTE — Telephone Encounter (Signed)
Prior Auth required for Pantoprazole sod 40 mg future refills. [id number]. Start dte:05/13/2012-06/01/13.

## 2012-06-28 ENCOUNTER — Other Ambulatory Visit: Payer: Self-pay

## 2012-06-28 DIAGNOSIS — T148XXA Other injury of unspecified body region, initial encounter: Secondary | ICD-10-CM

## 2012-06-30 MED ORDER — CYCLOBENZAPRINE HCL 10 MG PO TABS: 10.0000 mg | ORAL_TABLET | Freq: Three times a day (TID) | ORAL | Status: DC | PRN

## 2012-07-02 ENCOUNTER — Other Ambulatory Visit: Payer: Self-pay

## 2012-07-02 MED ORDER — CEVIMELINE HCL 30 MG PO CAPS: 30.0000 mg | ORAL_CAPSULE | Freq: Three times a day (TID) | ORAL | Status: DC

## 2012-07-08 ENCOUNTER — Other Ambulatory Visit: Payer: Self-pay | Admitting: *Deleted

## 2012-07-08 MED ORDER — CEVIMELINE HCL 30 MG PO CAPS: 30.0000 mg | ORAL_CAPSULE | Freq: Three times a day (TID) | ORAL | Status: DC

## 2012-07-28 ENCOUNTER — Other Ambulatory Visit: Payer: Self-pay | Admitting: *Deleted

## 2012-07-28 MED ORDER — SIMVASTATIN 20 MG PO TABS: 20.0000 mg | ORAL_TABLET | Freq: Every day | ORAL | Status: DC

## 2012-09-27 ENCOUNTER — Other Ambulatory Visit: Payer: Self-pay | Admitting: *Deleted

## 2012-09-27 DIAGNOSIS — I119 Hypertensive heart disease without heart failure: Secondary | ICD-10-CM

## 2012-09-27 DIAGNOSIS — E876 Hypokalemia: Secondary | ICD-10-CM

## 2012-09-27 MED ORDER — AMLODIPINE BESYLATE 10 MG PO TABS: 10.0000 mg | ORAL_TABLET | Freq: Every day | ORAL | Status: DC

## 2012-09-27 MED ORDER — METOPROLOL SUCCINATE ER 25 MG PO TB24: 25.0000 mg | ORAL_TABLET | Freq: Every day | ORAL | Status: DC

## 2012-09-27 MED ORDER — POTASSIUM CHLORIDE ER 10 MEQ PO CPCR: 10.0000 meq | ORAL_CAPSULE | ORAL | Status: DC

## 2012-09-29 ENCOUNTER — Other Ambulatory Visit: Payer: Self-pay | Admitting: *Deleted

## 2012-09-29 DIAGNOSIS — E876 Hypokalemia: Secondary | ICD-10-CM

## 2012-09-29 MED ORDER — POTASSIUM CHLORIDE ER 10 MEQ PO CPCR: 10.0000 meq | ORAL_CAPSULE | ORAL | Status: DC

## 2012-10-01 ENCOUNTER — Other Ambulatory Visit: Payer: Self-pay

## 2012-10-01 DIAGNOSIS — E876 Hypokalemia: Secondary | ICD-10-CM

## 2012-10-05 ENCOUNTER — Other Ambulatory Visit: Payer: Self-pay | Admitting: Cardiology

## 2012-10-05 DIAGNOSIS — E876 Hypokalemia: Secondary | ICD-10-CM

## 2012-10-05 DIAGNOSIS — F419 Anxiety disorder, unspecified: Secondary | ICD-10-CM

## 2012-10-05 MED ORDER — POTASSIUM CHLORIDE ER 10 MEQ PO CPCR: 10.0000 meq | ORAL_CAPSULE | ORAL | Status: DC

## 2012-10-05 NOTE — Telephone Encounter (Deleted)
error 

## 2012-10-06 MED ORDER — ALPRAZOLAM 0.5 MG PO TABS: 0.5000 mg | ORAL_TABLET | Freq: Two times a day (BID) | ORAL | Status: DC | PRN

## 2012-11-04 ENCOUNTER — Other Ambulatory Visit: Payer: Self-pay | Admitting: *Deleted

## 2012-11-08 ENCOUNTER — Other Ambulatory Visit: Payer: Self-pay | Admitting: *Deleted

## 2012-11-08 MED ORDER — HYDROCHLOROTHIAZIDE 12.5 MG PO CAPS: 12.5000 mg | ORAL_CAPSULE | Freq: Every day | ORAL | Status: DC

## 2012-11-08 NOTE — Telephone Encounter (Signed)
[facility] HCT[facility] 25 [facility] daily, refilled per  Dr. Patty Sermons for 12.5 mg daily (listed for 12.5 mg daily in chart)

## 2012-12-01 ENCOUNTER — Other Ambulatory Visit: Payer: Self-pay | Admitting: Cardiology

## 2012-12-01 DIAGNOSIS — M62838 Other muscle spasm: Secondary | ICD-10-CM

## 2013-01-04 ENCOUNTER — Other Ambulatory Visit: Payer: Self-pay | Admitting: Cardiology

## 2013-02-03 ENCOUNTER — Other Ambulatory Visit: Payer: Self-pay | Admitting: Cardiology

## 2013-03-22 ENCOUNTER — Other Ambulatory Visit: Payer: Self-pay | Admitting: Cardiology

## 2013-03-26 ENCOUNTER — Other Ambulatory Visit: Payer: Self-pay | Admitting: Cardiology

## 2013-04-05 ENCOUNTER — Encounter: Payer: Self-pay | Admitting: Cardiology

## 2013-04-05 ENCOUNTER — Ambulatory Visit (INDEPENDENT_AMBULATORY_CARE_PROVIDER_SITE_OTHER): Payer: BC Managed Care – PPO | Admitting: Cardiology

## 2013-04-05 VITALS — BP 128/76 | HR 92 | Ht <= 58 in | Wt 128.0 lb

## 2013-04-05 DIAGNOSIS — Z716 Tobacco abuse counseling: Secondary | ICD-10-CM

## 2013-04-05 DIAGNOSIS — Z7189 Other specified counseling: Secondary | ICD-10-CM

## 2013-04-05 DIAGNOSIS — E7800 Pure hypercholesterolemia, unspecified: Secondary | ICD-10-CM

## 2013-04-05 DIAGNOSIS — R079 Chest pain, unspecified: Secondary | ICD-10-CM

## 2013-04-05 DIAGNOSIS — F17200 Nicotine dependence, unspecified, uncomplicated: Secondary | ICD-10-CM

## 2013-04-05 DIAGNOSIS — M3500 Sicca syndrome, unspecified: Secondary | ICD-10-CM

## 2013-04-05 DIAGNOSIS — I119 Hypertensive heart disease without heart failure: Secondary | ICD-10-CM

## 2013-04-05 LAB — BASIC METABOLIC PANEL
BUN: 15 mg/dL (ref 6–23)
CO2: 33 mEq/L — ABNORMAL HIGH (ref 19–32)
GFR: 68.22 mL/min (ref >60.00)
Glucose, Bld: 87 mg/dL (ref 70–99)
Potassium: 3 mEq/L — ABNORMAL LOW (ref 3.5–5.1)
Sodium: 141 mEq/L (ref 135–145)

## 2013-04-05 LAB — HEPATIC FUNCTION PANEL
ALT: 16 U/L (ref 0–35)
AST: 26 U/L (ref 0–37)
Albumin: 4.1 g/dL (ref 3.5–5.2)
Bilirubin, Direct: 0 mg/dL (ref 0.0–0.3)
Total Bilirubin: 0.4 mg/dL (ref 0.3–1.2)
Total Protein: 7.4 g/dL (ref 6.0–8.3)

## 2013-04-05 LAB — LIPID PANEL
Cholesterol: 167 mg/dL (ref 0–200)
HDL: 60.1 mg/dL (ref >39.00)
LDL Cholesterol: 90 mg/dL (ref 0–99)
Total CHOL/HDL Ratio: 3

## 2013-04-05 NOTE — Assessment & Plan Note (Signed)
The patient has not been expressing any recent chest pain or angina. 

## 2013-04-05 NOTE — Patient Instructions (Addendum)
Will obtain labs today and call you with the results (lp/bmet/hfp)  Your physician recommends that you continue on your current medications as directed. Please refer to the Current Medication list given to you today.  Your physician wants you to follow-up in: 6 months with fasting labs (lp/bmet/hfp) and EKG You will receive a reminder letter in the mail two months in advance. If you don't receive a letter, please call our office to schedule the follow-up appointment.   CALL us WITH CORRECT DOSE OF HYDROCHLOROTHIAZIDE SO WE CAN MAKE SURE OUR RECORDS ARE CORRECT

## 2013-04-05 NOTE — Progress Notes (Signed)
Patricia Norman Date of Birth:  27-Feb-1951 [address] [place], [place]  [address] [phone]         Fax   [phone]  History of Present Illness: This pleasant 62 year old woman is seen for a scheduled six-month followup office visit. He has a past history of essential hypertension and a past history of sicca syndrome and postmenopausal state. She has ongoing cigarette usage and she has a history of dyslipidemia.  The patient does not have any history of ischemic heart disease although she has risk factors including family history and ongoing tobacco use, hypertension, and hyperlipidemia. She had a one day treadmill Cardiolite stress test on 08/10/09 which showed normal perfusion and an ejection fraction of 67%.  The patient had an echocardiogram 12/15/03 showed normal left ventricular systolic and diastolic function and mild mitral regurgitation and normal left atrial size.  Since we last saw her she has been under a lot of stress.  Her common-law husband died of lung cancer with brain metastases in 16-Nov-2011.  This has been very stressful for her.  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  She now splits her time between living in her home in [place] and visiting her daughter who lives in [place].  She is now also the primary caregiver for her elderly parents who live up in [place].  When we last saw the patient she had lost a great deal of weight because of stress.  Since then her stress level has decreased and her weight is up 16 pounds.   Current Outpatient Prescriptions  Medication Sig Dispense Refill  . ALPRAZolam (XANAX) 0.5 MG tablet Take 1 tablet (0.5 mg total) by mouth 2 (two) times daily as needed.  180 tablet  1  . amLODipine (NORVASC) 10 MG tablet TAKE 1 TABLET BY MOUTH EVERY DAY  90 tablet  0  . aspirin-acetaminophen-caffeine (EXCEDRIN MIGRAINE)  250-250-65 MG per tablet Take 1 tablet by mouth every 6 (six) hours as needed.        . Calcium Carbonate (CALCIUM 500 PO) Take 1,000 mg by mouth daily.        . cevimeline (EVOXAC) 30 MG capsule TAKE ONE CAPSULE BY MOUTH 3 TIMES A DAY  90 capsule  3  . cyclobenzaprine (FLEXERIL) 10 MG tablet TAKE 1 TABLET BY MOUTH 3 TIMES A DAY AS NEEDED FOR MUSCLE SPASMS  90 tablet  1  . estradiol (ESTRACE) 1 MG tablet Take 1 mg by mouth daily.       . fluticasone (FLONASE) 50 MCG/ACT nasal spray Place 1 spray into the nose daily.  16 g  5  . hydrochlorothiazide (MICROZIDE) 12.5 MG capsule Take 25 mg by mouth daily.      Marland Kitchen loratadine (CLARITIN) 10 MG tablet Take 10 mg by mouth as needed.        . metoprolol succinate (TOPROL-XL) 25 MG 24 hr tablet TAKE 1 TABLET BY MOUTH EVERY DAY  90 tablet  0  . Multiple Vitamin (MULTIVITAMIN) tablet Take 1 tablet by mouth daily.        Marland Kitchen omeprazole (PRILOSEC) 20 MG capsule Take 20 mg by mouth 2 (two) times daily.       Marland Kitchen oxybutynin (DITROPAN-XL) 10 MG 24 hr tablet Take 10 mg by mouth daily.        . potassium chloride (MICRO-K) 10 MEQ  CR capsule TAKE 2 CAPSULES BY MOUTH 3 TIMES A DAY  180 capsule  0  . sertraline (ZOLOFT) 50 MG tablet TAKE 1 TABLET BY MOUTH EVERY DAY 100 MG      . simvastatin (ZOCOR) 20 MG tablet TAKE 1 TABLET (20 MG TOTAL) BY MOUTH AT BEDTIME.  90 tablet  0  . pantoprazole (PROTONIX) 40 MG tablet Take 1 tablet (40 mg total) by mouth daily.  90 tablet  3   No current facility-administered medications for this visit.    Allergies  Allergen Reactions  . Ace Inhibitors   . Chantix [Varenicline Tartrate]   . Codeine   . Latex   . Spironolactone     Patient Active Problem List   Diagnosis Date Noted  . Sicca syndrome 02/21/2011  . Tobacco abuse counseling 02/21/2011  . Benign hypertensive heart disease without heart failure 02/21/2011  . Dyslipidemia 02/21/2011  . ASTHMATIC BRONCHITIS, ACUTE 09/09/2007  . DYSPNEA 09/09/2007  . CHEST PAIN  UNSPECIFIED 09/09/2007    History  Smoking status  . Current Every Day Smoker -- 0.50 packs/day  Smokeless tobacco  . Not on file    History  Alcohol Use No    Family History  Problem Relation Age of Onset  . Hypertension Mother   . Angina Father     Review of Systems: Constitutional: no fever chills diaphoresis or fatigue or change in weight.  Head and neck: no hearing loss, no epistaxis, no photophobia or visual disturbance. Respiratory: No cough, shortness of breath or wheezing. Cardiovascular: No chest pain peripheral edema, palpitations. Gastrointestinal: No abdominal distention, no abdominal pain, no change in bowel habits hematochezia or melena. Genitourinary: No dysuria, no frequency, no urgency, no nocturia. Musculoskeletal:No arthralgias, no back pain, no gait disturbance or myalgias. Neurological: No dizziness, no headaches, no numbness, no seizures, no syncope, no weakness, no tremors. Hematologic: No lymphadenopathy, no easy bruising. Psychiatric: No confusion, no hallucinations, no sleep disturbance.    Physical Exam: Filed Vitals:   04/05/13 1108  BP: 128/76  Pulse: 92   the general appearance reveals a well-developed well-nourished woman in no distress.  She has lost 11 pounds since we last saw her.The head and neck exam reveals pupils equal and reactive.  Extraocular movements are full.  There is no scleral icterus.  The mouth and pharynx are normal.  The neck is supple.  The carotids reveal no bruits.  The jugular venous pressure is normal.  The  thyroid is not enlarged.  There is no lymphadenopathy.  The chest is clear to percussion and auscultation.  There are no rales or rhonchi.  Expansion of the chest is symmetrical.  The precordium is quiet.  The first heart sound is normal.  The second heart sound is physiologically split.  There is no murmur gallop rub or click.  There is no abnormal lift or heave.  The abdomen is soft and nontender.  The bowel sounds  are normal.  The liver and spleen are not enlarged.  There are no abdominal masses.  There are no abdominal bruits.  Extremities reveal good pedal pulses.  There is no phlebitis or edema.  There is no cyanosis or clubbing.  Strength is normal and symmetrical in all extremities.  There is no lateralizing weakness.  There are no sensory deficits.  The skin is warm and dry.  There is no rash.  There is a darkly pigmented small lesion on her left lower anterior chest and she will have her dermatologist check  this.     Assessment / Plan: Overall the patient has been doing well.  She is not sure what dose of HCTZ she is taking and she will call us back about that.  We are checking fasting lab work today.  Recheck in 6 months for office visit EKG lipid panel hepatic function panel and nasal metabolic panel.  She will try not to gain any more weight.

## 2013-04-05 NOTE — Assessment & Plan Note (Signed)
The patient continues to smoke against advice.  She has been able to stop for up to several weeks at a time but then under increased stress start smoking again.  She wants to quit because she watched her husband die of lung cancer.  She denies any cough or sputum production.

## 2013-04-05 NOTE — Assessment & Plan Note (Signed)
Blood pressure has been remaining stable on current medication.  She continues to have frequent stress headaches.  She takes Flexeril for her headaches.

## 2013-04-07 NOTE — Progress Notes (Signed)
Quick Note:  Please report to patient. The recent labs are stable. Continue same medication and careful diet. Lipids are normal. Potassium is still very low. Has she been taking her potassium pills as directed??? ______

## 2013-04-12 ENCOUNTER — Other Ambulatory Visit: Payer: Self-pay | Admitting: Cardiology

## 2013-04-13 ENCOUNTER — Encounter: Payer: Self-pay | Admitting: *Deleted

## 2013-05-02 ENCOUNTER — Other Ambulatory Visit: Payer: Self-pay | Admitting: Cardiology

## 2013-06-07 ENCOUNTER — Other Ambulatory Visit: Payer: Self-pay | Admitting: Cardiology

## 2013-07-05 ENCOUNTER — Other Ambulatory Visit: Payer: Self-pay | Admitting: Cardiology

## 2013-07-06 ENCOUNTER — Other Ambulatory Visit: Payer: Self-pay

## 2013-07-06 MED ORDER — AMLODIPINE BESYLATE 10 MG PO TABS: ORAL_TABLET | ORAL | Status: DC

## 2013-07-09 ENCOUNTER — Other Ambulatory Visit: Payer: Self-pay | Admitting: Cardiology

## 2013-09-01 ENCOUNTER — Other Ambulatory Visit: Payer: Self-pay | Admitting: Cardiology

## 2013-09-05 ENCOUNTER — Other Ambulatory Visit: Payer: Self-pay | Admitting: *Deleted

## 2013-09-05 MED ORDER — CEVIMELINE HCL 30 MG PO CAPS: 30.0000 mg | ORAL_CAPSULE | Freq: Three times a day (TID) | ORAL | Status: DC

## 2013-10-03 ENCOUNTER — Ambulatory Visit (INDEPENDENT_AMBULATORY_CARE_PROVIDER_SITE_OTHER): Payer: BC Managed Care – PPO | Admitting: Cardiology

## 2013-10-03 ENCOUNTER — Encounter: Payer: Self-pay | Admitting: Cardiology

## 2013-10-03 VITALS — BP 132/87 | HR 89 | Ht <= 58 in | Wt 117.8 lb

## 2013-10-03 DIAGNOSIS — E7800 Pure hypercholesterolemia, unspecified: Secondary | ICD-10-CM

## 2013-10-03 DIAGNOSIS — M3500 Sicca syndrome, unspecified: Secondary | ICD-10-CM

## 2013-10-03 DIAGNOSIS — F17200 Nicotine dependence, unspecified, uncomplicated: Secondary | ICD-10-CM

## 2013-10-03 DIAGNOSIS — I119 Hypertensive heart disease without heart failure: Secondary | ICD-10-CM

## 2013-10-03 DIAGNOSIS — Z716 Tobacco abuse counseling: Secondary | ICD-10-CM

## 2013-10-03 DIAGNOSIS — E876 Hypokalemia: Secondary | ICD-10-CM

## 2013-10-03 DIAGNOSIS — Z7189 Other specified counseling: Secondary | ICD-10-CM

## 2013-10-03 NOTE — Assessment & Plan Note (Signed)
The patient continues to smoke against advice.  She states she is trying to quit.  She has not been having a significant cough wheezing or dyspnea since last visit.

## 2013-10-03 NOTE — Assessment & Plan Note (Signed)
She is on hydrochlorothiazide.  She is taking 6 potassium pills per day.  If her potassium remains low we will need to work her up for possible hyperaldosteronism with an early morning plasma renin and and plasma aldosterone ratio.

## 2013-10-03 NOTE — Patient Instructions (Signed)
Will obtain labs today and call you with the results (lp/bmet/hfp)  Your physician wants you to follow-up in: 6 months with fasting labs (lp/bmet/hfp)  You will receive a reminder letter in the mail two months in advance. If you don't receive a letter, please call our office to schedule the follow-up appointment.   Your physician recommends that you continue on your current medications as directed. Please refer to the Current Medication list given to you today.   

## 2013-10-03 NOTE — Progress Notes (Signed)
Patricia Norman Date of Birth:  12/21/1950 [address] [place], [place]  [address] [phone]         Fax   [phone]  History of Present Illness: This pleasant 63 year old woman is seen for a scheduled six-month followup office visit. He has a past history of essential hypertension and a past history of sicca syndrome and postmenopausal state. She has ongoing cigarette usage and she has a history of dyslipidemia.  The patient does not have any history of ischemic heart disease although she has risk factors including family history and ongoing tobacco use, hypertension, and hyperlipidemia. She had a one day treadmill Cardiolite stress test on 08/10/09 which showed normal perfusion and an ejection fraction of 67%.  The patient had an echocardiogram 12/15/03 showed normal left ventricular systolic and diastolic function and mild mitral regurgitation and normal left atrial size.  Since we last saw her she has been under a lot of stress.  Her common-law husband died of lung cancer with brain metastases in May 2013.  This has been very stressful for her.  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  She now splits her time between living in her home in [place] and visiting her daughter who lives in [place].  She is now also the primary caregiver for her elderly parents who live up in [place].  She has a past history of hypokalemia.  We are rechecking labs today.  She has lost 11 pounds since last visit.  She has had some intermittent diarrhea.   Current Outpatient Prescriptions  Medication Sig Dispense Refill  . ALPRAZolam (XANAX) 0.5 MG tablet TAKE 1 TABLET BY MOUTH TWICE A DAY AS NEEDED  180 tablet  1  . amLODipine (NORVASC) 10 MG tablet TAKE 1 TABLET BY MOUTH EVERY DAY  90 tablet  0  . aspirin-acetaminophen-caffeine (EXCEDRIN MIGRAINE) 250-250-65 MG per tablet Take 1  tablet by mouth every 6 (six) hours as needed.        . Calcium Carbonate (CALCIUM 500 PO) Take 1,000 mg by mouth daily.        . cevimeline (EVOXAC) 30 MG capsule Take 1 capsule (30 mg total) by mouth 3 (three) times daily.  90 capsule  1  . Cyanocobalamin (VITAMIN B 12 PO) Take by mouth daily.      . cyclobenzaprine (FLEXERIL) 10 MG tablet TAKE 1 TABLET BY MOUTH 3 TIMES A DAY AS NEEDED FOR MUSCLE SPASMS  90 tablet  3  . estradiol (ESTRACE) 1 MG tablet Take 1 mg by mouth daily.       . fluticasone (FLONASE) 50 MCG/ACT nasal spray Place 1 spray into the nose daily.  16 g  5  . hydrochlorothiazide (MICROZIDE) 12.5 MG capsule Take 25 mg by mouth daily.      Marland Kitchen. loratadine (CLARITIN) 10 MG tablet Take 10 mg by mouth as needed.        . Melatonin 5 MG TABS Take by mouth at bedtime as needed.      . metoprolol succinate (TOPROL-XL) 25 MG 24 hr tablet TAKE 1 TABLET BY MOUTH EVERY DAY  90 tablet  0  . Multiple Vitamin (MULTIVITAMIN) tablet Take 1 tablet by mouth daily.        Marland Kitchen. omeprazole (PRILOSEC) 20 MG capsule Take 20 mg by mouth 2 (two) times daily.       .Marland Kitchen  oxybutynin (DITROPAN-XL) 10 MG 24 hr tablet Take 10 mg by mouth daily.        . potassium chloride (MICRO-K) 10 MEQ CR capsule TAKE 2 CAPSULES BY MOUTH 3 TIMES A DAY  180 capsule  1  . Probiotic Product (PROBIOTIC DAILY PO) Take by mouth daily.      . sertraline (ZOLOFT) 50 MG tablet TAKE 1 TABLET BY MOUTH EVERY DAY 100 MG      . simvastatin (ZOCOR) 20 MG tablet TAKE 1 TABLET BY MOUTH AT BEDTIME **CALL OFFICE FOR FOLLOW UP APPT**  90 tablet  1  . traZODone (DESYREL) 50 MG tablet        No current facility-administered medications for this visit.    Allergies  Allergen Reactions  . Ace Inhibitors   . Chantix [Varenicline Tartrate]   . Codeine   . Latex   . Spironolactone     Patient Active Problem List   Diagnosis Date Noted  . Sicca syndrome 02/21/2011  . Tobacco abuse counseling 02/21/2011  . Benign hypertensive heart disease  without heart failure 02/21/2011  . Dyslipidemia 02/21/2011  . ASTHMATIC BRONCHITIS, ACUTE 09/09/2007  . DYSPNEA 09/09/2007  . CHEST PAIN UNSPECIFIED 09/09/2007    History  Smoking status  . Current Every Day Smoker -- 0.50 packs/day  Smokeless tobacco  . Not on file    History  Alcohol Use No    Family History  Problem Relation Age of Onset  . Hypertension Mother   . Angina Father     Review of Systems: Constitutional: no fever chills diaphoresis or fatigue or change in weight.  Head and neck: no hearing loss, no epistaxis, no photophobia or visual disturbance. Respiratory: No cough, shortness of breath or wheezing. Cardiovascular: No chest pain peripheral edema, palpitations. Gastrointestinal: No abdominal distention, no abdominal pain, no change in bowel habits hematochezia or melena. Genitourinary: No dysuria, no frequency, no urgency, no nocturia. Musculoskeletal:No arthralgias, no back pain, no gait disturbance or myalgias. Neurological: No dizziness, no headaches, no numbness, no seizures, no syncope, no weakness, no tremors. Hematologic: No lymphadenopathy, no easy bruising. Psychiatric: No confusion, no hallucinations, no sleep disturbance.    Physical Exam: Filed Vitals:   10/03/13 1512  BP: 132/87  Pulse: 89   the general appearance reveals a well-developed well-nourished woman in no distress.  She has lost 11 pounds since we last saw her.The head and neck exam reveals pupils equal and reactive.  Extraocular movements are full.  There is no scleral icterus.  The mouth and pharynx are normal.  The neck is supple.  The carotids reveal no bruits.  The jugular venous pressure is normal.  The  thyroid is not enlarged.  There is no lymphadenopathy.  The chest is clear to percussion and auscultation.  There are no rales or rhonchi.  Expansion of the chest is symmetrical.  The precordium is quiet.  The first heart sound is normal.  The second heart sound is  physiologically split.  There is no murmur gallop rub or click.  There is no abnormal lift or heave.  The abdomen is soft and nontender.  The bowel sounds are normal.  The liver and spleen are not enlarged.  There are no abdominal masses.  There are no abdominal bruits.  Extremities reveal good pedal pulses.  There is no phlebitis or edema.  There is no cyanosis or clubbing.  Strength is normal and symmetrical in all extremities.  There is no lateralizing weakness.  There are no  sensory deficits.  The skin is warm and dry.  There is no rash.  There is a darkly pigmented small lesion on her left lower anterior chest and she will have her dermatologist check this.  EKG shows normal sinus rhythm and is within normal limits.  No EKG changes of hypokalemia today.   Assessment / Plan: Overall the patient has been doing well.  .  We are checking fasting lab work today.  Recheck in 6 months for office visit EKG lipid panel hepatic function panel and basal metabolic panel.

## 2013-10-03 NOTE — Assessment & Plan Note (Signed)
Her blood pressure has been remaining stable on current therapy.  No dizziness or syncope.  No symptoms of CHF.

## 2013-10-04 ENCOUNTER — Encounter: Payer: Self-pay | Admitting: *Deleted

## 2013-10-04 LAB — BASIC METABOLIC PANEL
BUN: 12 mg/dL (ref 6–23)
CHLORIDE: 102 meq/L (ref 96–112)
CO2: 29 mEq/L (ref 19–32)
Calcium: 9.5 mg/dL (ref 8.4–10.5)
Creatinine, Ser: 0.8 mg/dL (ref 0.4–1.2)
GFR: 72.81 mL/min (ref >60.00)
Glucose, Bld: 95 mg/dL (ref 70–99)
Potassium: 2.9 mEq/L — ABNORMAL LOW (ref 3.5–5.1)
SODIUM: 141 meq/L (ref 135–145)

## 2013-10-04 LAB — LIPID PANEL
Cholesterol: 178 mg/dL (ref 0–200)
HDL: 47.6 mg/dL (ref >39.00)
LDL CALC: 105 mg/dL — AB (ref 0–99)
TRIGLYCERIDES: 127 mg/dL (ref 0.0–149.0)
Total CHOL/HDL Ratio: 4
VLDL: 25.4 mg/dL (ref 0.0–40.0)

## 2013-10-04 LAB — HEPATIC FUNCTION PANEL
ALBUMIN: 4.1 g/dL (ref 3.5–5.2)
ALK PHOS: 42 U/L (ref 39–117)
ALT: 17 U/L (ref 0–35)
AST: 29 U/L (ref 0–37)
Bilirubin, Direct: 0.1 mg/dL (ref 0.0–0.3)
Total Bilirubin: 0.4 mg/dL (ref 0.3–1.2)
Total Protein: 7.3 g/dL (ref 6.0–8.3)

## 2013-10-10 ENCOUNTER — Telehealth: Payer: Self-pay | Admitting: *Deleted

## 2013-10-10 DIAGNOSIS — E876 Hypokalemia: Secondary | ICD-10-CM

## 2013-10-10 NOTE — Telephone Encounter (Signed)
Advised patient and scheduled lab appointment

## 2013-10-10 NOTE — Telephone Encounter (Signed)
Message copied by Burnell BlanksPRATT, Elisha Mcgruder B on Mon Oct 10, 2013  4:43 PM ------      Message from: Cassell ClementBRACKBILL, THOMAS      Created: Wed Oct 05, 2013  7:51 AM       Please report.  Lipids are good.  Liver tests are good.  Her potassium remains very low.  Continue Kdur.  Have her return for a early morning plasma renin and and plasma aldosterone ratio. ------

## 2013-10-11 ENCOUNTER — Other Ambulatory Visit: Payer: Self-pay | Admitting: *Deleted

## 2013-10-11 ENCOUNTER — Other Ambulatory Visit (INDEPENDENT_AMBULATORY_CARE_PROVIDER_SITE_OTHER): Payer: BC Managed Care – PPO

## 2013-10-11 ENCOUNTER — Telehealth: Payer: Self-pay | Admitting: *Deleted

## 2013-10-11 DIAGNOSIS — E876 Hypokalemia: Secondary | ICD-10-CM

## 2013-10-11 DIAGNOSIS — E7800 Pure hypercholesterolemia, unspecified: Secondary | ICD-10-CM

## 2013-10-11 LAB — LIPID PANEL
CHOL/HDL RATIO: 3
Cholesterol: 169 mg/dL (ref 0–200)
HDL: 56 mg/dL (ref >39.00)
LDL Cholesterol: 102 mg/dL — ABNORMAL HIGH (ref 0–99)
TRIGLYCERIDES: 55 mg/dL (ref 0.0–149.0)
VLDL: 11 mg/dL (ref 0.0–40.0)

## 2013-10-11 LAB — HEPATIC FUNCTION PANEL
ALBUMIN: 4.1 g/dL (ref 3.5–5.2)
ALT: 15 U/L (ref 0–35)
AST: 28 U/L (ref 0–37)
Alkaline Phosphatase: 41 U/L (ref 39–117)
Bilirubin, Direct: 0.1 mg/dL (ref 0.0–0.3)
Total Bilirubin: 0.3 mg/dL (ref 0.2–1.2)
Total Protein: 7.4 g/dL (ref 6.0–8.3)

## 2013-10-11 LAB — BASIC METABOLIC PANEL
BUN: 15 mg/dL (ref 6–23)
CHLORIDE: 102 meq/L (ref 96–112)
CO2: 25 meq/L (ref 19–32)
Calcium: 9 mg/dL (ref 8.4–10.5)
Creatinine, Ser: 0.9 mg/dL (ref 0.4–1.2)
GFR: 68.1 mL/min (ref >60.00)
Glucose, Bld: 86 mg/dL (ref 70–99)
POTASSIUM: 3.2 meq/L — AB (ref 3.5–5.1)
SODIUM: 138 meq/L (ref 135–145)

## 2013-10-11 MED ORDER — OMEPRAZOLE 20 MG PO CPDR: 20.0000 mg | DELAYED_RELEASE_CAPSULE | Freq: Every day | ORAL | Status: DC

## 2013-10-11 MED ORDER — SERTRALINE HCL 100 MG PO TABS: 150.0000 mg | ORAL_TABLET | Freq: Every day | ORAL | Status: DC

## 2013-10-11 MED ORDER — POTASSIUM CHLORIDE ER 10 MEQ PO CPCR: 10.0000 meq | ORAL_CAPSULE | Freq: Every day | ORAL | Status: DC

## 2013-10-11 MED ORDER — TRAZODONE HCL 50 MG PO TABS: 50.0000 mg | ORAL_TABLET | Freq: Every day | ORAL | Status: AC

## 2013-10-11 NOTE — Telephone Encounter (Signed)
Patient walked in today to t/w Regis BillMelinda Pratt, LPN, had several questions we addressed needed Potassium refill ( 10 meq ) daily sent into [facility] [place].  Had question about HCTZ is only taking (12.5 mg ) daily and AVS stated (25 mg ) daily, will have to be called back, Omperazole ( 20 mg ) daily, Zoloft instead of ( 50 mg ) pt is taking (150 mg ) daily and Vit D is 1000 daily can't seem to add that to list. Will route this message to [place]. I fixed list except Vit D and needs to address the HCTZ

## 2013-10-12 ENCOUNTER — Other Ambulatory Visit: Payer: Self-pay | Admitting: Cardiology

## 2013-10-13 NOTE — Progress Notes (Signed)
Quick Note:  Please report to patient. The recent labs are stable. Continue same medication and careful diet. The potassium is still low. The lipids are stable. The liver tests are normal. We're still waiting for the aldosterone and Renin levels ______

## 2013-10-14 LAB — ALDOSTERONE: ALDOSTERONE, SERUM: 18 ng/dL

## 2013-10-14 NOTE — Telephone Encounter (Signed)
Notes Recorded by Burnell BlanksMelinda B Remigio Mcmillon on 10/13/2013 at 5:47 PM Left message to call back

## 2013-10-17 LAB — RENIN: Renin Activity: 2.15 ng/mL/h (ref 0.25–5.82)

## 2013-10-24 ENCOUNTER — Telehealth: Payer: Self-pay | Admitting: *Deleted

## 2013-10-24 DIAGNOSIS — E876 Hypokalemia: Secondary | ICD-10-CM

## 2013-10-24 MED ORDER — POTASSIUM CHLORIDE ER 10 MEQ PO CPCR: ORAL_CAPSULE | ORAL | Status: DC

## 2013-10-24 NOTE — Telephone Encounter (Signed)
Message copied by Burnell BlanksPRATT, Eshan Trupiano B on Mon Oct 24, 2013  5:37 PM ------      Message from: Cassell ClementBRACKBILL, THOMAS      Created: Tue Oct 18, 2013  7:30 AM       Please report.  The aldosterone and renin levels are normal.  To keep her potassium where it should be,, it is important for her to take the potassium tablets as prescribed. ------

## 2013-10-24 NOTE — Telephone Encounter (Signed)
Message copied by Burnell BlanksPRATT, Saira Kramme B on Mon Oct 24, 2013  5:29 PM ------      Message from: Cassell ClementBRACKBILL, THOMAS      Created: Fri Oct 14, 2013 12:51 PM       Aldosterone level is normal.  The renin level is not yet back ------

## 2013-10-24 NOTE — Telephone Encounter (Signed)
Advised patient of labs.  Notes Recorded by Burnell BlanksMelinda B Pratt on 10/20/2013 at 2:51 PM Left message to call back Notes Recorded by Cassell Clementhomas Brackbill, MD on 10/18/2013 at 7:30 AM Please report. The aldosterone and renin levels are normal. To keep her potassium where it should be,, it is important for her to take the potassium tablets as prescribed. Notes Recorded by Cassell Clementhomas Brackbill, MD on 10/14/2013 at 12:51 PM Aldosterone level is normal. The renin level is not yet back Notes Recorded by Burnell BlanksMelinda B Pratt on 10/13/2013 at 5:47 PM Left message to call back Notes Recorded by Loa SocksIvy M Martin, LPN on 1/6/10965/10/2013 at 5:53 PM Preliminarily reviewed. Forwarded to MD desktop for review and signature

## 2013-10-25 NOTE — Telephone Encounter (Signed)
Went over medications with patient and she is taking K+ 3 tablets twice a day

## 2013-10-27 ENCOUNTER — Other Ambulatory Visit: Payer: Self-pay | Admitting: Cardiology

## 2013-10-28 ENCOUNTER — Other Ambulatory Visit: Payer: Self-pay | Admitting: Cardiology

## 2013-10-28 DIAGNOSIS — F419 Anxiety disorder, unspecified: Secondary | ICD-10-CM

## 2013-12-26 ENCOUNTER — Other Ambulatory Visit: Payer: Self-pay | Admitting: Cardiology

## 2013-12-26 DIAGNOSIS — T7840XS Allergy, unspecified, sequela: Secondary | ICD-10-CM

## 2013-12-26 DIAGNOSIS — M3500 Sicca syndrome, unspecified: Secondary | ICD-10-CM

## 2014-01-18 ENCOUNTER — Other Ambulatory Visit: Payer: Self-pay | Admitting: Cardiology

## 2014-03-31 ENCOUNTER — Other Ambulatory Visit: Payer: Self-pay | Admitting: Cardiology

## 2014-03-31 DIAGNOSIS — M545 Low back pain, unspecified: Secondary | ICD-10-CM

## 2014-03-31 DIAGNOSIS — M3500 Sicca syndrome, unspecified: Secondary | ICD-10-CM

## 2014-05-02 ENCOUNTER — Other Ambulatory Visit: Payer: Self-pay | Admitting: Cardiology

## 2014-05-14 ENCOUNTER — Other Ambulatory Visit: Payer: Self-pay | Admitting: Cardiology

## 2014-07-03 ENCOUNTER — Other Ambulatory Visit: Payer: Self-pay | Admitting: Cardiology

## 2014-07-19 ENCOUNTER — Ambulatory Visit: Payer: Self-pay | Admitting: Cardiology

## 2014-07-20 ENCOUNTER — Ambulatory Visit (INDEPENDENT_AMBULATORY_CARE_PROVIDER_SITE_OTHER): Payer: BC Managed Care – PPO | Admitting: Cardiology

## 2014-07-20 ENCOUNTER — Encounter: Payer: Self-pay | Admitting: Cardiology

## 2014-07-20 VITALS — BP 124/86 | HR 75 | Ht <= 58 in | Wt 118.8 lb

## 2014-07-20 DIAGNOSIS — I119 Hypertensive heart disease without heart failure: Secondary | ICD-10-CM

## 2014-07-20 LAB — BASIC METABOLIC PANEL
BUN: 16 mg/dL (ref 6–23)
CALCIUM: 9.5 mg/dL (ref 8.4–10.5)
CO2: 30 mEq/L (ref 19–32)
Chloride: 105 mEq/L (ref 96–112)
Creatinine, Ser: 0.85 mg/dL (ref 0.40–1.20)
GFR: 71.64 mL/min (ref >60.00)
Glucose, Bld: 91 mg/dL (ref 70–99)
POTASSIUM: 3.9 meq/L (ref 3.5–5.1)
Sodium: 140 mEq/L (ref 135–145)

## 2014-07-20 LAB — HEPATIC FUNCTION PANEL
ALBUMIN: 4.3 g/dL (ref 3.5–5.2)
ALT: 15 U/L (ref 0–35)
AST: 20 U/L (ref 0–37)
Alkaline Phosphatase: 49 U/L (ref 39–117)
Bilirubin, Direct: 0.1 mg/dL (ref 0.0–0.3)
Total Bilirubin: 0.4 mg/dL (ref 0.2–1.2)
Total Protein: 7.2 g/dL (ref 6.0–8.3)

## 2014-07-20 LAB — LIPID PANEL
CHOL/HDL RATIO: 4
Cholesterol: 203 mg/dL — ABNORMAL HIGH (ref 0–200)
HDL: 55 mg/dL (ref >39.00)
LDL CALC: 124 mg/dL — AB (ref 0–99)
NonHDL: 148
TRIGLYCERIDES: 119 mg/dL (ref 0.0–149.0)
VLDL: 23.8 mg/dL (ref 0.0–40.0)

## 2014-07-20 NOTE — Patient Instructions (Signed)
Will obtain labs today and call you with the results (LP/BMET/HFP)  Your physician recommends that you continue on your current medications as directed. Please refer to the Current Medication list given to you today.  Your physician wants you to follow-up in: 6 months with fasting labs (lp/bmet/hfp)  You will receive a reminder letter in the mail two months in advance. If you don't receive a letter, please call our office to schedule the follow-up appointment.  

## 2014-07-20 NOTE — Progress Notes (Signed)
Cardiology Office Note   Date:  07/20/2014   ID:  Patricia Bladeeborah Vaccaro, DOB 10/15/1950, MRN [id number]  PCP:  Donald PorePIERCE,HELEN, MD  Cardiologist:   Cassell Clementhomas Tilmon Wisehart, MD   No chief complaint on file.     History of Present Illness: Patricia Norman is a 64 y.o. female who presents for scheduled follow-up office visit.  This pleasant 64 year old woman is seen for a scheduled six-month followup office visit. He has a past history of essential hypertension and a past history of sicca syndrome and postmenopausal state. She has ongoing cigarette usage and she has a history of dyslipidemia. The patient does not have any history of ischemic heart disease although she has risk factors including family history and ongoing tobacco use, hypertension, and hyperlipidemia. She had a one day treadmill Cardiolite stress test on 08/10/09 which showed normal perfusion and an ejection fraction of 67%. The patient had an echocardiogram 12/15/03 showed normal left ventricular systolic and diastolic function and mild mitral regurgitation and normal left atrial size. Since we last saw her she has been under a lot of stress. Her common-law husband died of lung cancer with brain metastases in May 2013. This has been very stressful for her. 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 She now splits her time between living in her home in [place] and visiting her daughter who lives in [place]. She is now also the primary caregiver for her elderly parents who live up in [place]. She has a past history of hypokalemia. We are rechecking labs today.  She has not been having any chest pain.  She reports that her symptom of dry eyes has improved on Evoxac. She reports that her smoking has decreased.  She is now smoking off and on. She had a complete physical last month with her primary care provider with [facility].  Past  Medical History  Diagnosis Date  . Hypertension   . Migraine headache   . Tobacco abuse   . Hyperlipidemia   . Chest pain   . History of constipation     Past Surgical History  Procedure Laterality Date  . Colonoscopy      DR. JOHN HAYES  . Partial hysterectomy    . Cardiovascular stress test  08/10/2009    EF 67%  . [place] echocardiography  12/15/2003    EF 55-60%     Current Outpatient Prescriptions  Medication Sig Dispense Refill  . ALPRAZolam (XANAX) 0.5 MG tablet TAKE 1 TABLET BY MOUTH TWICE A DAY AS NEEDED 180 tablet 1  . amLODipine (NORVASC) 10 MG tablet TAKE 1 TABLET BY MOUTH EVERY DAY 90 tablet 0  . aspirin-acetaminophen-caffeine (EXCEDRIN MIGRAINE) 250-250-65 MG per tablet Take 1 tablet by mouth every 6 (six) hours as needed.      . Calcium Carbonate (CALCIUM 500 PO) Take 1,000 mg by mouth daily.      . cevimeline (EVOXAC) 30 MG capsule TAKE 1 CAPSULE THREE TIMES A DAY 90 capsule 0  . cholecalciferol (VITAMIN D) 1000 UNITS tablet Take 1,000 Units by mouth daily.     . cyclobenzaprine (FLEXERIL) 10 MG tablet TAKE 1 TABLET BY MOUTH 3 TIMES A DAY AS NEEDED FOR MUSCLE SPASMS 90 tablet 2  . fluticasone (FLONASE) 50 MCG/ACT nasal spray USE 1 SPRAY IN NOSE AS DIRECTED DAILY (Patient taking differently: prn) 16 g 3  . hydrochlorothiazide (MICROZIDE) 12.5 MG  capsule TAKE ONE CAPSULE BY MOUTH DAILY (Patient taking differently: prn) 90 capsule 0  . loratadine (CLARITIN) 10 MG tablet Take 10 mg by mouth as needed.      . Melatonin 5 MG TABS Take by mouth at bedtime as needed.    . metoprolol succinate (TOPROL-XL) 25 MG 24 hr tablet TAKE 1 TABLET BY MOUTH EVERY DAY 90 tablet 0  . Multiple Vitamin (MULTIVITAMIN) tablet Take 1 tablet by mouth daily.      Marland Kitchen omeprazole (PRILOSEC) 20 MG capsule Take 1 capsule (20 mg total) by mouth daily. 30 capsule 3  . oxybutynin (DITROPAN-XL) 10 MG 24 hr tablet Take 10 mg by mouth daily.      . potassium chloride (MICRO-K) 10 MEQ CR capsule 3 tablets twice  a day 540 capsule 3  . Probiotic Product (PROBIOTIC DAILY PO) Take by mouth daily.    . sertraline (ZOLOFT) 100 MG tablet Take 1.5 tablets (150 mg total) by mouth daily. 60 tablet 3  . simvastatin (ZOCOR) 20 MG tablet TAKE 1 TABLET (20 MG TOTAL) BY MOUTH AT BEDTIME. 90 tablet 0  . traZODone (DESYREL) 50 MG tablet Take 1 tablet (50 mg total) by mouth daily. 30 tablet 3  . Cyanocobalamin (VITAMIN B 12 PO) Take by mouth daily.     No current facility-administered medications for this visit.    Allergies:   Ace inhibitors; Chantix; Codeine; Latex; and Spironolactone    Social History:  The patient  reports that she has been smoking.  She does not have any smokeless tobacco history on file. She reports that she does not drink alcohol or use illicit drugs.   Family History:  The patient's family history includes Angina in her father; Hypertension in her mother.    ROS:  Please see the history of present illness.   Otherwise, review of systems are positive for none.   All other systems are reviewed and negative.    PHYSICAL EXAM: VS:  BP 124/86 mmHg  Pulse 75  Ht  (1.473 m)  Wt 118 lb 12.8 oz (53.887 kg)  BMI 24.84 kg/m2 , BMI Body mass index is 24.84 kg/(m^2). GEN: Well nourished, well developed, in no acute distress HEENT: normal Neck: no JVD, carotid bruits, or masses Cardiac: RRR; no murmurs, rubs, or gallops,no edema  Respiratory:  clear to auscultation bilaterally, normal work of breathing GI: soft, nontender, nondistended, + BS MS: no deformity or atrophy Skin: warm and dry, no rash Neuro:  Strength and sensation are intact Psych: euthymic mood, full affect   EKG:  EKG is ordered today. The ekg ordered today demonstrates normal sinus rhythm.  No ischemic changes.   Recent Labs: 07/20/2014: ALT 15; BUN 16; Creatinine 0.85; Potassium 3.9; Sodium 140    Lipid Panel    Component Value Date/Time   CHOL 203* 07/20/2014 1025   TRIG 119.0 07/20/2014 1025   HDL 55.00  07/20/2014 1025   CHOLHDL 4 07/20/2014 1025   VLDL 23.8 07/20/2014 1025   LDLCALC 124* 07/20/2014 1025      Wt Readings from Last 3 Encounters:  07/20/14 118 lb 12.8 oz (53.887 kg)  10/03/13 117 lb 12.8 oz (53.434 kg)  04/05/13 128 lb (58.06 kg)        ASSESSMENT AND PLAN: 1.  Essential hypertension without heart failure 2.  Sicca syndrome 3.  Tobacco abuse 4.  Dyslipidemia 5.  History of hypokalemia  Position: We are checking lab work today.  Continue same medications.  Work  harder to continue to quit smoking.  Current medicines are reviewed at length with the patient today.  The patient does not have concerns regarding medicines.  The following changes have been made:  no change  Labs/ tests ordered today include: Lipid panel, hepatic function panel, basal metabolic panel.    Orders Placed This Encounter  Procedures  . Lipid panel  . Hepatic function panel  . Basic metabolic panel  . Lipid panel  . Hepatic function panel  . Basic metabolic panel  . EKG 12-Lead     Disposition:   FU with Dr. Patty Sermons in 6 months office visit and fasting lab work.   Signed, Cassell Clement, MD  07/20/2014 6:23 PM    [facility] [address] [place], [place], [place]  [address] Phone: [phone]; Fax: [phone]

## 2014-07-22 ENCOUNTER — Other Ambulatory Visit: Payer: Self-pay | Admitting: Cardiology

## 2014-07-22 DIAGNOSIS — F419 Anxiety disorder, unspecified: Secondary | ICD-10-CM

## 2014-07-24 ENCOUNTER — Other Ambulatory Visit: Payer: Self-pay | Admitting: *Deleted

## 2014-07-24 DIAGNOSIS — E876 Hypokalemia: Secondary | ICD-10-CM

## 2014-07-24 NOTE — Telephone Encounter (Signed)
Okay to refill? 

## 2014-07-26 ENCOUNTER — Other Ambulatory Visit: Payer: Self-pay | Admitting: Cardiology

## 2014-07-26 DIAGNOSIS — M3500 Sicca syndrome, unspecified: Secondary | ICD-10-CM

## 2014-08-07 ENCOUNTER — Other Ambulatory Visit: Payer: Self-pay | Admitting: *Deleted

## 2014-08-07 MED ORDER — SERTRALINE HCL 100 MG PO TABS: 150.0000 mg | ORAL_TABLET | Freq: Every day | ORAL | Status: AC

## 2014-08-07 MED ORDER — METOPROLOL SUCCINATE ER 25 MG PO TB24: 25.0000 mg | ORAL_TABLET | Freq: Every day | ORAL | Status: AC

## 2014-08-07 MED ORDER — POTASSIUM CHLORIDE ER 10 MEQ PO CPCR
ORAL_CAPSULE | ORAL | Status: DC
Start: 2014-08-07 — End: 2014-11-14

## 2014-08-07 MED ORDER — OMEPRAZOLE 20 MG PO CPDR: 20.0000 mg | DELAYED_RELEASE_CAPSULE | Freq: Every day | ORAL | Status: AC

## 2014-08-07 MED ORDER — SIMVASTATIN 20 MG PO TABS: ORAL_TABLET | ORAL | Status: DC

## 2014-11-13 ENCOUNTER — Other Ambulatory Visit: Payer: Self-pay | Admitting: Cardiology

## 2014-11-13 DIAGNOSIS — R682 Dry mouth, unspecified: Principal | ICD-10-CM

## 2014-11-13 DIAGNOSIS — K117 Disturbances of salivary secretion: Secondary | ICD-10-CM

## 2014-11-13 DIAGNOSIS — M791 Myalgia, unspecified site: Secondary | ICD-10-CM

## 2014-11-14 ENCOUNTER — Other Ambulatory Visit: Payer: Self-pay

## 2014-11-14 ENCOUNTER — Telehealth: Payer: Self-pay

## 2014-11-14 DIAGNOSIS — E876 Hypokalemia: Secondary | ICD-10-CM

## 2014-11-14 DIAGNOSIS — M3500 Sicca syndrome, unspecified: Secondary | ICD-10-CM

## 2014-11-14 MED ORDER — AMLODIPINE BESYLATE 10 MG PO TABS: 10.0000 mg | ORAL_TABLET | Freq: Every day | ORAL | Status: DC

## 2014-11-14 MED ORDER — POTASSIUM CHLORIDE ER 10 MEQ PO CPCR: ORAL_CAPSULE | ORAL | Status: DC

## 2014-11-14 MED ORDER — CEVIMELINE HCL 30 MG PO CAPS: 30.0000 mg | ORAL_CAPSULE | Freq: Three times a day (TID) | ORAL | Status: DC

## 2014-11-14 NOTE — Telephone Encounter (Signed)
OK to refill cevimeline

## 2014-11-14 NOTE — Telephone Encounter (Signed)
Per note 2.1.16 

## 2015-02-08 ENCOUNTER — Telehealth: Payer: Self-pay

## 2015-02-08 ENCOUNTER — Other Ambulatory Visit: Payer: Self-pay | Admitting: Cardiology

## 2015-02-08 DIAGNOSIS — F419 Anxiety disorder, unspecified: Secondary | ICD-10-CM

## 2015-02-08 NOTE — Telephone Encounter (Signed)
Spoke with patient and she was able to get a 30 day supply from her PCP's office

## 2015-02-08 NOTE — Telephone Encounter (Signed)
pt calling wanting refill of trazodone-PCP wont refill until she has a office visit--she wants a call back from nurse to ask if Dr. Patty Sermons will refill the trazodone?

## 2015-02-08 NOTE — Telephone Encounter (Signed)
Okay to refill? 

## 2015-06-10 DIAGNOSIS — I639 Cerebral infarction, unspecified: Secondary | ICD-10-CM

## 2015-06-10 HISTORY — DX: Cerebral infarction, unspecified: I63.9

## 2015-07-30 ENCOUNTER — Other Ambulatory Visit: Payer: Self-pay | Admitting: Cardiology

## 2015-07-30 DIAGNOSIS — M3500 Sicca syndrome, unspecified: Secondary | ICD-10-CM

## 2015-07-30 DIAGNOSIS — F419 Anxiety disorder, unspecified: Secondary | ICD-10-CM

## 2015-07-30 DIAGNOSIS — I10 Essential (primary) hypertension: Secondary | ICD-10-CM

## 2015-08-08 ENCOUNTER — Ambulatory Visit (INDEPENDENT_AMBULATORY_CARE_PROVIDER_SITE_OTHER): Payer: BC Managed Care – PPO | Admitting: Cardiology

## 2015-08-08 ENCOUNTER — Encounter: Payer: Self-pay | Admitting: Cardiology

## 2015-08-08 VITALS — BP 110/70 | HR 68 | Ht <= 58 in | Wt 132.0 lb

## 2015-08-08 DIAGNOSIS — E7800 Pure hypercholesterolemia, unspecified: Secondary | ICD-10-CM | POA: Diagnosis not present

## 2015-08-08 DIAGNOSIS — M25562 Pain in left knee: Secondary | ICD-10-CM | POA: Diagnosis not present

## 2015-08-08 DIAGNOSIS — I119 Hypertensive heart disease without heart failure: Secondary | ICD-10-CM | POA: Diagnosis not present

## 2015-08-08 DIAGNOSIS — M3500 Sicca syndrome, unspecified: Secondary | ICD-10-CM

## 2015-08-08 LAB — CBC WITH DIFFERENTIAL/PLATELET
Basophils Absolute: 0.1 K/uL (ref 0.0–0.1)
Basophils Relative: 1 % (ref 0–1)
Eosinophils Absolute: 0 K/uL (ref 0.0–0.7)
Eosinophils Relative: 0 % (ref 0–5)
HCT: 35.6 % — ABNORMAL LOW (ref 36.0–46.0)
Hemoglobin: 12 g/dL (ref 12.0–15.0)
Lymphocytes Relative: 12 % (ref 12–46)
Lymphs Abs: 1 K/uL (ref 0.7–4.0)
MCH: 30.8 pg (ref 26.0–34.0)
MCHC: 33.7 g/dL (ref 30.0–36.0)
MCV: 91.3 fL (ref 78.0–100.0)
MPV: 11.8 fL (ref 8.6–12.4)
Monocytes Absolute: 0.7 K/uL (ref 0.1–1.0)
Monocytes Relative: 9 % (ref 3–12)
Neutro Abs: 6.4 K/uL (ref 1.7–7.7)
Neutrophils Relative %: 78 % — ABNORMAL HIGH (ref 43–77)
Platelets: 132 K/uL — ABNORMAL LOW (ref 150–400)
RBC: 3.9 MIL/uL (ref 3.87–5.11)
RDW: 13.6 % (ref 11.5–15.5)
WBC: 8.2 K/uL (ref 4.0–10.5)

## 2015-08-08 LAB — BASIC METABOLIC PANEL WITH GFR
BUN: 15 mg/dL (ref 7–25)
CO2: 29 mmol/L (ref 20–31)
Calcium: 8.8 mg/dL (ref 8.6–10.4)
Chloride: 106 mmol/L (ref 98–110)
Creat: 0.87 mg/dL (ref 0.50–0.99)
Glucose, Bld: 85 mg/dL (ref 65–99)
Potassium: 4.5 mmol/L (ref 3.5–5.3)
Sodium: 139 mmol/L (ref 135–146)

## 2015-08-08 LAB — LIPID PANEL
CHOLESTEROL: 152 mg/dL (ref 125–200)
HDL: 45 mg/dL — ABNORMAL LOW (ref >=46)
LDL Cholesterol: 80 mg/dL (ref <130)
TRIGLYCERIDES: 136 mg/dL (ref <150)
Total CHOL/HDL Ratio: 3.4 Ratio (ref <=5.0)
VLDL: 27 mg/dL (ref <30)

## 2015-08-08 LAB — HEPATIC FUNCTION PANEL
ALT: 12 U/L (ref 6–29)
AST: 23 U/L (ref 10–35)
Albumin: 4 g/dL (ref 3.6–5.1)
Alkaline Phosphatase: 56 U/L (ref 33–130)
Bilirubin, Direct: 0.1 mg/dL
Indirect Bilirubin: 0.2 mg/dL (ref 0.2–1.2)
Total Bilirubin: 0.3 mg/dL (ref 0.2–1.2)
Total Protein: 6.9 g/dL (ref 6.1–8.1)

## 2015-08-08 MED ORDER — AMLODIPINE BESYLATE 5 MG PO TABS: 5.0000 mg | ORAL_TABLET | Freq: Every day | ORAL | Status: AC

## 2015-08-08 NOTE — Patient Instructions (Addendum)
Medication Instructions:  DECREASE YOUR AMLODIPINE TO 5 MG DAILY  Labwork: LP/BMET/HFP/CBC  Testing/Procedures: NONE  Follow-Up: AS NEEDED  Any Other Special Instructions Will Be Listed Below (If Applicable). YOU ARE SCHEDULED TO SEE DR GIOFFRE ON 08/20/2015 AT 2:30. YOU WILL NEED TO ARRIVE AT 2:00. MAKE SURE YOU HAVE PHOTO ID, INSURANCE CARDS, AND COPAY AT TIME OF CHECK IN. IF YOU NEED TO RESCHEDULE CALL THE OFFICE DIRECTLY AT [phone]  If you need a refill on your cardiac medications before your next appointment, please call your pharmacy.

## 2015-08-08 NOTE — Progress Notes (Signed)
Cardiology Office Note   Date:  08/08/2015   ID:  Patricia Norman, DOB July 27, 1950, MRN [id number]  PCP:  Donald Pore, MD  Cardiologist: Cassell Clement MD  Chief Complaint  Patient presents with  . scheduled follow up    c/o dizziness, excessive fatigue, and lee      History of Present Illness: Patricia Norman is a 65 y.o. female who presents for  Scheduled follow-up office visit    She has a past history of essential hypertension and a past history of sicca syndrome and postmenopausal state.  She has a past history of tobacco abuse.  She finally stopped smoking altogether in July 2016.   she has a history of dyslipidemia. The patient does not have any history of ischemic heart disease although she has risk factors including family history and  hypertension, and hyperlipidemia. She had a one day treadmill Cardiolite stress test on 08/10/09 which showed normal perfusion and an ejection fraction of 67%. The patient had an echocardiogram 12/15/03 showed normal left ventricular systolic and diastolic function and mild mitral regurgitation and normal left atrial size. she is a widow. Her common-law husband died of lung cancer with brain metastases in May 2013.   since we last saw her, her father died at age 36 of dementia and leukemia.  The patient's mother is still living at age 67.  She has not been having any chest pain. She reports that her symptom of dry eyes has improved on Evoxac.  she has been having pain in her left knee. She has pain at rest and when she tries to walk.  No history of trauma. We will refer to orthopedist. We will also give her a handicap sticker at her request.  The patient has been having some peripheral edema.  No symptoms of CHF.  I suspect that the edema is from her amlodipine 10 mg daily.  We will reduce the dose to 5 mg daily and observe response. Past Medical History  Diagnosis Date  . Hypertension   . Migraine headache   . Tobacco abuse   .  Hyperlipidemia   . Chest pain   . History of constipation     Past Surgical History  Procedure Laterality Date  . Colonoscopy      DR. JOHN HAYES  . Partial hysterectomy    . Cardiovascular stress test  08/10/2009    EF 67%  . US echocardiography  12/15/2003    EF 55-60%     Current Outpatient Prescriptions  Medication Sig Dispense Refill  . ALPRAZolam (XANAX) 0.5 MG tablet Take 0.5 mg by mouth 2 (two) times daily as needed for anxiety.    Marland Kitchen amLODipine (NORVASC) 5 MG tablet Take 1 tablet (5 mg total) by mouth daily. 90 tablet 3  . aspirin-acetaminophen-caffeine (EXCEDRIN MIGRAINE) 250-250-65 MG per tablet Take 1 tablet by mouth every 6 (six) hours as needed for headache.     . Calcium Carbonate (CALCIUM 500 PO) Take 1,000 mg by mouth daily.      . cevimeline (EVOXAC) 30 MG capsule Take 1 capsule (30 mg total) by mouth 3 (three) times daily. 90 capsule 2  . cholecalciferol (VITAMIN D) 1000 UNITS tablet Take 1,000 Units by mouth daily.     . cyclobenzaprine (FLEXERIL) 10 MG tablet TAKE 1 TABLET BY MOUTH 3 TIMES A DAY AS NEEDED FOR MUSCLE SPASMS 90 tablet 3  . estradiol (ESTRACE) 1 MG tablet Take 1 mg by mouth daily.  4  . famciclovir [facility])  500 MG tablet Take 3 tablets by mouth as directed. Take 3 tablets by mouth once daily at onset of fever blisters  1  . fluticasone (FLONASE) 50 MCG/ACT nasal spray Place 1 spray into both nostrils daily as needed for allergies or rhinitis.    . hydrochlorothiazide (MICROZIDE) 12.5 MG capsule TAKE ONE CAPSULE BY MOUTH DAILY 90 capsule 5  . loratadine (CLARITIN) 10 MG tablet Take 10 mg by mouth as needed for allergies or rhinitis.     . Melatonin 5 MG TABS Take 5 mg by mouth at bedtime as needed (sleep).     . metoprolol succinate (TOPROL-XL) 25 MG 24 hr tablet Take 1 tablet (25 mg total) by mouth daily. 90 tablet 3  . Multiple Vitamin (MULTIVITAMIN) tablet Take 1 tablet by mouth daily.      Marland Kitchen omeprazole (PRILOSEC) 20 MG capsule Take 1 capsule (20 mg  total) by mouth daily. 90 capsule 3  . oxybutynin (DITROPAN-XL) 10 MG 24 hr tablet Take 10 mg by mouth daily.      . potassium chloride (MICRO-K) 10 MEQ CR capsule Take 30 mEq by mouth 2 (two) times daily.    . Probiotic Product (PROBIOTIC DAILY PO) Take 1 capsule by mouth daily.     . sertraline (ZOLOFT) 100 MG tablet Take 1.5 tablets (150 mg total) by mouth daily. 135 tablet 3  . simvastatin (ZOCOR) 20 MG tablet TAKE 1 TABLET (20 MG TOTAL) BY MOUTH AT BEDTIME. 90 tablet 3  . traZODone (DESYREL) 50 MG tablet Take 1 tablet (50 mg total) by mouth daily. 30 tablet 3   No current facility-administered medications for this visit.    Allergies:   Varenicline; Ace inhibitors; Chantix; Codeine; Spironolactone; and Latex    Social History:  The patient  reports that she has been smoking.  She does not have any smokeless tobacco history on file. She reports that she does not drink alcohol or use illicit drugs.   Family History:  The patient's family history includes Angina in her father; Hypertension in her mother.    ROS:  Please see the history of present illness.   Otherwise, review of systems are positive for none.   All other systems are reviewed and negative.    PHYSICAL EXAM: VS:  BP 110/70 mmHg  Pulse 68  Ht 4\' 9"  (1.448 m)  Wt 132 lb (59.875 kg)  BMI 28.56 kg/m2 , BMI Body mass index is 28.56 kg/(m^2). GEN: Well nourished, well developed, in no acute distress HEENT: normal Neck: no JVD, carotid bruits, or masses Cardiac: RRR; no murmurs, rubs, or gallops,no edema  Respiratory:  clear to auscultation bilaterally, normal work of breathing GI: soft, nontender, nondistended, + BS MS: no deformity or atrophy Skin: warm and dry, no rash Neuro:  Strength and sensation are intact Psych: euthymic mood, full affect   EKG:  EKG is ordered today. The ekg ordered today demonstrates  Normal sinus rhythm at 64 bpm. Within normal limits.   Recent Labs: No results found for requested labs  within last 365 days.    Lipid Panel    Component Value Date/Time   CHOL 203* 07/20/2014 1025   TRIG 119.0 07/20/2014 1025   HDL 55.00 07/20/2014 1025   CHOLHDL 4 07/20/2014 1025   VLDL 23.8 07/20/2014 1025   LDLCALC 124* 07/20/2014 1025      Wt Readings from Last 3 Encounters:  08/08/15 132 lb (59.875 kg)  07/20/14 118 lb 12.8 oz (53.887 kg)  10/03/13 117  lb 12.8 oz (53.434 kg)        ASSESSMENT AND PLAN:  1.Essential hypertension without heart failure 2. Sicca syndrome 3. Tobacco abuse, she quit smoking in July 2016 4. Dyslipidemia 5. History of hypokalemia 6. Left knee pain. Suspect osteoarthritis 7.  Peripheral edema probably side effect from amlodipine  Current medicines are reviewed at length with the patient today.  The patient does not have concerns regarding medicines.  The following changes have been made:   Decrease amlodipine down to 5 mg daily  Labs/ tests ordered today include:   Orders Placed This Encounter  Procedures  . Lipid panel  . Hepatic function panel  . Basic metabolic panel  . CBC with Differential/Platelet  . EKG 12-Lead     Disposition:    Following my retirement the patient will follow-up with her primary care physician. She will return to cardiology on a when necessary basis.  Karie Schwalbe MD 08/08/2015 1:06 PM    [facility] [address] [place], [place], [place]  [address] Phone: [phone]; Fax: [phone]

## 2015-08-09 NOTE — Progress Notes (Signed)
Quick Note:  Please report to patient. The recent labs are stable. Continue same medication and careful diet. The lipids are excellent. The liver function is normal. Continue simvastatin. ______

## 2015-09-03 ENCOUNTER — Ambulatory Visit (HOSPITAL_COMMUNITY): Payer: BC Managed Care – PPO | Attending: Orthopedic Surgery

## 2015-09-03 DIAGNOSIS — R262 Difficulty in walking, not elsewhere classified: Secondary | ICD-10-CM

## 2015-09-03 DIAGNOSIS — G729 Myopathy, unspecified: Secondary | ICD-10-CM | POA: Diagnosis present

## 2015-09-03 DIAGNOSIS — M25562 Pain in left knee: Secondary | ICD-10-CM | POA: Diagnosis present

## 2015-09-03 DIAGNOSIS — R29898 Other symptoms and signs involving the musculoskeletal system: Secondary | ICD-10-CM | POA: Diagnosis present

## 2015-09-03 DIAGNOSIS — M6289 Other specified disorders of muscle: Secondary | ICD-10-CM

## 2015-09-03 NOTE — Patient Instructions (Signed)
   STRAIGHT LEG RAISE - SLR  While lying, raise up your leg with a straight knee.  Keep the opposite knee bent with the foot planted to the bed    Make sure to keep your knee straight as you lift the right surgical leg.   Lift your leg ~12 inches off the table/bed   10-15 reps, 2x/day        HAMSTRING STRETCH WITH TOWEL  While lying down on your back, hook a towel or strap under  your foot and draw up your leg until a stretch is felt under your leg. calf area.  Keep your knee in a straightened position during the stretch.  3 sets of 30 seconds, Complete 2-3x/day

## 2015-09-03 NOTE — Therapy (Signed)
[place] [facility] [address] [place], [place], [address] Phone: [phone]   Fax:  [phone]  Physical Therapy Evaluation  Patient Details  Name: Mattingly Fountaine MRN: [id number] Date of Birth: 01-22-1951 Referring Provider: Dr. Ranee Gosselin   Encounter Date: 09/03/2015      PT End of Session - 09/03/15 1722    Visit Number 1   Number of Visits 13   Date for PT Re-Evaluation 10/03/15   Authorization Type BCBS    Authorization Time Period 09/03/2015 to 10/15/2015   PT Start Time 1608   PT Stop Time 1657   PT Time Calculation (min) 49 min   Activity Tolerance Patient tolerated treatment well   Behavior During Therapy [facility] for tasks assessed/performed      Past Medical History  Diagnosis Date  . Hypertension   . Migraine headache   . Tobacco abuse   . Hyperlipidemia   . Chest pain   . History of constipation     Past Surgical History  Procedure Laterality Date  . Colonoscopy      DR. JOHN HAYES  . Partial hysterectomy    . Cardiovascular stress test  08/10/2009    EF 67%  . US echocardiography  12/15/2003    EF 55-60%    There were no vitals filed for this visit.  Visit Diagnosis:  Pain in left knee - Plan: PT plan of care cert/re-cert  Weakness of both lower extremities - Plan: PT plan of care cert/re-cert  Difficulty in walking, not elsewhere classified - Plan: PT plan of care cert/re-cert  Muscle tightness - Plan: PT plan of care cert/re-cert      Subjective Assessment - 09/03/15 1620    Subjective Ms. Robarts is a 65 yo female who currently c/o L anterior knee pain and L leg weakness that initially began >5-6 years ago, but has worsened within the last few months. Pt reports that she saw Dr. Darrelyn Hillock and was given a cortisone injection into her L knee pain with fair to good relief reported. However, she noted that her symptoms are slowly returning. Pt reported that she has difficulty with squatting and is limited by  pain. Pt stated "I feel like my thighs are weak".    Pertinent History HTN, chest pain, hx of chronic L knee pain    Limitations Standing;Walking   How long can you sit comfortably? 1 hour    How long can you stand comfortably? 30 minutes    How long can you walk comfortably? 30 minutes    Diagnostic tests X-rays completed to the L knee= arthritis and inflammation according to pt report    Patient Stated Goals Pt's goal is to reduce her L knee pain and improve strength of her B LE in order to work in her garden.    Currently in Pain? Yes   Pain Score 1   L knee pain ranges between a 0-3/10 on a VAS   Pain Location Knee   Pain Orientation Left;Anterior   Pain Descriptors / Indicators Aching   Pain Type Chronic pain   Pain Radiating Towards none   Pain Onset More than a month ago   Pain Frequency Intermittent   Aggravating Factors  WB activities, long distance ambulation, and squatting    Pain Relieving Factors rest and pain meds    Effect of Pain on Daily Activities Pt is limited with yard work and household chores secondary to pain.    Multiple Pain Sites No  [facility] - 09/03/15 0001    Assessment   Medical Diagnosis Left knee pain    Referring Provider Dr. Ranee Gosselinonald Gioffre    Onset Date/Surgical Date 08/20/15   Hand Dominance Right   Next MD Visit No f/u appt at this time    Prior Therapy None    Precautions   Precautions None   Restrictions   Weight Bearing Restrictions No   Balance Screen   Has the patient fallen in the past 6 months No   Has the patient had a decrease in activity level because of a fear of falling?  No   Is the patient reluctant to leave their home because of a fear of falling?  No   Prior Function   Level of Independence Independent   Cognition   Overall Cognitive Status Within Functional Limits for tasks assessed   Observation/Other Assessments   Focus on Therapeutic Outcomes (FOTO)  52% limited    Sensation   Light Touch  Appears Intact   Functional Tests   Functional tests Squat;Single leg stance   Squat   Comments poor squatting technique with L knee pain at 60 degree    Single Leg Stance   Comments SLS ( L/R)= 11 sec/ 8 sec    ROM / Strength   AROM / PROM / Strength AROM;Strength   AROM   AROM Assessment Site Knee   Right/Left Knee Right;Left   Right Knee Extension 0   Right Knee Flexion 131   Left Knee Extension 0   Left Knee Flexion 130   Strength   Strength Assessment Site Knee;Hip;Ankle   Right/Left Hip Right;Left   Right Hip Flexion 4-/5   Right Hip Extension 3+/5   Right Hip ABduction 4-/5   Right Hip ADduction 4-/5   Left Hip Flexion 4-/5   Left Hip Extension 3+/5   Left Hip ABduction 4-/5   Left Hip ADduction 4-/5   Right/Left Knee Right;Left   Right Knee Flexion 4-/5   Right Knee Extension 5/5   Left Knee Flexion 4-/5   Left Knee Extension 4/5   Right/Left Ankle Right;Left   Right Ankle Dorsiflexion 5/5   Left Ankle Dorsiflexion 5/5   Flexibility   Soft Tissue Assessment /Muscle Length yes   Hamstrings HS 90/90 (L/R)= -30 deg/-30 deg    Quadriceps Positive for tightness bilaterally with Thomas test   ITB Positive for tightness bilaterally with Obers test   Palpation   Patella mobility WNL    Palpation comment Palpable tenderness at medial joint line of the L knee    Special Tests    Special Tests Knee Special Tests   Knee Special tests  other   other    Findings Negative   Comments Valgus/Varus/Lachmans/ McMurrays= negative for symptoms    Ambulation/Gait   Ambulation/Gait Yes   Ambulation/Gait Assistance 7: Independent   Assistive device None   Gait Pattern Within Functional Limits                           PT Education - 09/03/15 1721    Education provided Yes   Education Details Initial HEP, proper squatting technique, and PT eval findings   Person(s) Educated Patient   Methods Explanation;Demonstration;Verbal cues;Handout   Comprehension  Verbalized understanding;Returned demonstration          PT Short Term Goals - 09/03/15 1734    PT SHORT TERM GOAL #1   Title Patient will independently verbalize  and demo full understanding with HEP in order to improve compliance and progress towards improved function.    Time 3   Period Weeks   Status New   PT SHORT TERM GOAL #2   Title Patient will demo improved L SLS to >20 seconds in order to improve L knee stabilization and balance with long distance ambulation.    Time 3   Period Weeks   Status New   PT SHORT TERM GOAL #3   Title Patient will independently verbalize pain management strategies including use of heat therapy in order to reduce pain levels with prolonged standing.    Time 3   Period Weeks   Status New           PT Long Term Goals - 09/03/15 1738    PT LONG TERM GOAL #1   Title Patient will report decreased L knee pain to 0-1/10 on a VAS in order to improve tolerance with community ambulation to >45 minutes.    Time 6   Period Weeks   Status New   PT LONG TERM GOAL #2   Title Patient will improve B quad/HS strength to 5/5 MMT in order to improve L knee stabilization with gardening activities.    Time 6   Period Weeks   Status New   PT LONG TERM GOAL #3   Title Patient will improve B hip strength to >4/5 MMT in order to improve tolerance with prolonged standing for >45 minutes.    Time 6   Period Weeks   Status New   PT LONG TERM GOAL #4   Title Patient will be able to squat to 75 degrees of knee flexion, pain-free, in order to return to household and yard work activities with less pain.    Time 6   Period Weeks   Status New   PT LONG TERM GOAL #5   Title Patient will improve her FOTO limitation score to <30% in order to progress towards her PLOF.    Time 6   Period Weeks   Status New               Plan - 09/03/15 1723    Clinical Impression Statement Ms. Luper is a pleasant 65 yo female who was referred to outpatient PT with  initial complaints of anterior L knee pain. The pt presents with signs and symptoms that are consistent with L knee pain associated with possible OA of the L knee joint. Special test of the knee were negative for pain or joint laxity when compared bilaterally.  Palpable tenderness was assessed at the medial joint line of the L knee. Upon further examination, the pt presented with L quad /HS weakness, L hip weakness, and tight HS/quad/ITB flexibility. Furthermore, she c/o increased L medial knee pain with squatting >60 degrees of knee flexion. No palpable or visible edema/swelling assessed of the L knee. B knee AROM are WNL. The pt denied clicking, popping, or the knee giving way when ambulating. The pt would benefit from skilled PT to address current pain, strength, and flexibility deficits in order to return to her PLOF with leisure activities including gardening. The pt is in agreement with PT POC and frequency and is motivated to participate with PT services.    Pt will benefit from skilled therapeutic intervention in order to improve on the following deficits Decreased strength;Difficulty walking;Impaired flexibility;Pain;Decreased balance   Rehab Potential Good   PT Frequency 2x / week   PT Duration 6 weeks  PT Treatment/Interventions ADLs/Self Care Home Management;Cryotherapy;Electrical Stimulation;Moist Heat;Ultrasound;Therapeutic activities;Therapeutic exercise;Neuromuscular re-education;Patient/family education;Manual techniques;Passive range of motion;Taping   PT Next Visit Plan Next visit to focus on SLR x 4, SAQ, quad sets, HS/quad stretches, and manual techniques to reduce L knee pain.    PT Home Exercise Plan HEP initiated this visit with addition of SLR into flexion and HS towel stretch in supine; Added additional hip and knee strengthening ther ex at next PT visit    Recommended Other Services None at time    Consulted and Agree with Plan of Care Patient         Problem List Patient  Active Problem List   Diagnosis Date Noted  . Left knee pain 08/08/2015  . Hypokalemia 10/03/2013  . Sicca syndrome (HCC) 02/21/2011  . Tobacco abuse counseling 02/21/2011  . Benign hypertensive heart disease without heart failure 02/21/2011  . Dyslipidemia 02/21/2011  . ASTHMATIC BRONCHITIS, ACUTE 09/09/2007  . DYSPNEA 09/09/2007  . CHEST PAIN UNSPECIFIED 09/09/2007    Bonnee Quin, PT, DPT   09/03/2015, 5:45 PM  [facility] [address] [place], [place], [address] Phone: [phone]   Fax:  [phone]  Name: Iran Kievit MRN: [id number] Date of Birth: 07-05-1950

## 2015-09-04 ENCOUNTER — Ambulatory Visit (HOSPITAL_COMMUNITY): Payer: BC Managed Care – PPO

## 2015-09-04 DIAGNOSIS — R262 Difficulty in walking, not elsewhere classified: Secondary | ICD-10-CM

## 2015-09-04 DIAGNOSIS — M25562 Pain in left knee: Secondary | ICD-10-CM | POA: Diagnosis not present

## 2015-09-04 DIAGNOSIS — R29898 Other symptoms and signs involving the musculoskeletal system: Secondary | ICD-10-CM

## 2015-09-04 DIAGNOSIS — M6289 Other specified disorders of muscle: Secondary | ICD-10-CM

## 2015-09-04 NOTE — Therapy (Signed)
[place] [facility] [address] [place], [place], [address] Phone: [phone]   Fax:  [phone]  Physical Therapy Treatment  Patient Details  Name: Nalla Purdy MRN: [id number] Date of Birth: 1951-01-22 Referring Provider: Dr. Ranee Gosselin   Encounter Date: 09/04/2015      PT End of Session - 09/04/15 1745    Visit Number 2   Number of Visits 13   Date for PT Re-Evaluation 10/03/15   Authorization Type BCBS    Authorization Time Period 09/03/2015 to 10/15/2015   PT Start Time 1735   PT Stop Time 1815   PT Time Calculation (min) 40 min   Activity Tolerance Patient tolerated treatment well   Behavior During Therapy [facility] for tasks assessed/performed      Past Medical History  Diagnosis Date  . Hypertension   . Migraine headache   . Tobacco abuse   . Hyperlipidemia   . Chest pain   . History of constipation     Past Surgical History  Procedure Laterality Date  . Colonoscopy      DR. JOHN HAYES  . Partial hysterectomy    . Cardiovascular stress test  08/10/2009    EF 67%  . US echocardiography  12/15/2003    EF 55-60%    There were no vitals filed for this visit.  Visit Diagnosis:  Pain in left knee  Weakness of both lower extremities  Difficulty in walking, not elsewhere classified  Muscle tightness      Subjective Assessment - 09/04/15 1739    Subjective Pt reports she has minimal pain today.  Stated she has increased pain with standing or walking.  Reports compliance with HEP and stated it made her tired last night, no questions concerning exercises.     Pertinent History HTN, chest pain, hx of chronic L knee pain    Patient Stated Goals Pt's goal is to reduce her L knee pain and improve strength of her B LE in order to work in her garden.    Currently in Pain? Yes   Pain Score 1    Pain Location Knee   Pain Orientation Left;Anterior   Pain Descriptors / Indicators Aching   Pain Type Chronic pain   Pain  Radiating Towards none   Pain Onset More than a month ago   Pain Frequency Intermittent   Aggravating Factors  WB activities, long distance ambulation, and squatting   Pain Relieving Factors rest and pain meds   Effect of Pain on Daily Activities Pt is limited with yard work and household chores secondary to pain               [facility] Adult PT Treatment/Exercise - 09/04/15 0001    Exercises   Exercises Knee/Hip   Knee/Hip Exercises: Stretches   Active Hamstring Stretch Both;3 reps;30 seconds   Active Hamstring Stretch Limitations supine with rope   Knee/Hip Exercises: Supine   Quad Sets Left;10 reps   Short Arc Quad Sets 15 reps   Bridges 10 reps   Straight Leg Raises 10 reps   Knee/Hip Exercises: Sidelying   Hip ABduction Both;10 reps   Hip ABduction Limitations therapist facilitation for form   Knee/Hip Exercises: Prone   Hip Extension Both;10 reps   Hip Extension Limitations cueing for form            PT Education - 09/03/15 1721    Education provided Yes   Education Details Initial HEP, proper squatting technique, and PT eval  findings   Person(s) Educated Patient   Methods Explanation;Demonstration;Verbal cues;Handout   Comprehension Verbalized understanding;Returned demonstration          PT Short Term Goals - 09/03/15 1734    PT SHORT TERM GOAL #1   Title Patient will independently verbalize and demo full understanding with HEP in order to improve compliance and progress towards improved function.    Time 3   Period Weeks   Status New   PT SHORT TERM GOAL #2   Title Patient will demo improved L SLS to >20 seconds in order to improve L knee stabilization and balance with long distance ambulation.    Time 3   Period Weeks   Status New   PT SHORT TERM GOAL #3   Title Patient will independently verbalize pain management strategies including use of heat therapy in order to reduce pain levels with prolonged standing.    Time 3   Period Weeks   Status New            PT Long Term Goals - 09/03/15 1738    PT LONG TERM GOAL #1   Title Patient will report decreased L knee pain to 0-1/10 on a VAS in order to improve tolerance with community ambulation to >45 minutes.    Time 6   Period Weeks   Status New   PT LONG TERM GOAL #2   Title Patient will improve B quad/HS strength to 5/5 MMT in order to improve L knee stabilization with gardening activities.    Time 6   Period Weeks   Status New   PT LONG TERM GOAL #3   Title Patient will improve B hip strength to >4/5 MMT in order to improve tolerance with prolonged standing for >45 minutes.    Time 6   Period Weeks   Status New   PT LONG TERM GOAL #4   Title Patient will be able to squat to 75 degrees of knee flexion, pain-free, in order to return to household and yard work activities with less pain.    Time 6   Period Weeks   Status New   PT LONG TERM GOAL #5   Title Patient will improve her FOTO limitation score to <30% in order to progress towards her PLOF.    Time 6   Period Weeks   Status New               Plan - 09/04/15 1745    Clinical Impression Statement Reviewed goals, compliance and proper technique with HEP and copy of evaluation given to pt.  Pt able to demonstrate appropriate mechanics with all current HEP and was given additional HEP exercises to add hip and knee strengthening.  No reports of increased pain through sessoin.  Reviewed pain management strategies and encourage pt to complete if increased pain following session.   Pt will benefit from skilled therapeutic intervention in order to improve on the following deficits Decreased strength;Difficulty walking;Impaired flexibility;Pain;Decreased balance   Rehab Potential Good   PT Frequency 2x / week   PT Duration 6 weeks   PT Treatment/Interventions ADLs/Self Care Home Management;Cryotherapy;Electrical Stimulation;Moist Heat;Ultrasound;Therapeutic activities;Therapeutic exercise;Neuromuscular  re-education;Patient/family education;Manual techniques;Passive range of motion;Taping   PT Next Visit Plan Next visit to focus on SLR x 4, SAQ, quad sets, HS/quad stretches, and manual techniques to reduce L knee pain; progress CKC exercises as able.     PT Home Exercise Plan Added bridges, quad sets and abduction  Problem List Patient Active Problem List   Diagnosis Date Noted  . Left knee pain 08/08/2015  . Hypokalemia 10/03/2013  . Sicca syndrome (HCC) 02/21/2011  . Tobacco abuse counseling 02/21/2011  . Benign hypertensive heart disease without heart failure 02/21/2011  . Dyslipidemia 02/21/2011  . ASTHMATIC BRONCHITIS, ACUTE 09/09/2007  . DYSPNEA 09/09/2007  . CHEST PAIN UNSPECIFIED 09/09/2007   Becky Sax, LPTA; CBIS [phone]  Juel Burrow 09/04/2015, 6:26 PM  [facility] [address] [place], [place], [address] Phone: [phone]   Fax:  [phone]  Name: Joely Losier MRN: [id number] Date of Birth: Dec 15, 1950

## 2015-09-04 NOTE — Patient Instructions (Signed)
Quad Sets    Slowly tighten thigh muscles of straight, left leg while counting out loud to 5 seconds. Relax. Repeat 10-20 times. Do 1-2 sessions per day.  http://gt2.exer.us/293   Copyright  VHI. All rights reserved.   Bridge    Lie back, legs bent. Inhale, pressing hips up. Keeping ribs in, lengthen lower back. Exhale, rolling down along spine from top. Repeat 10-20 times. Do 1-2 sessions per day.  http://pm.exer.us/55   Copyright  VHI. All rights reserved.   Abduction    Lift leg up toward ceiling. Return.  Repeat 10-20 times each leg. Do 1-2 sessions per day.  http://gt2.exer.us/386   Copyright  VHI. All rights reserved.

## 2015-09-11 ENCOUNTER — Ambulatory Visit (HOSPITAL_COMMUNITY): Payer: BC Managed Care – PPO | Attending: Orthopedic Surgery | Admitting: Physical Therapy

## 2015-09-11 DIAGNOSIS — M25562 Pain in left knee: Secondary | ICD-10-CM | POA: Insufficient documentation

## 2015-09-11 DIAGNOSIS — G729 Myopathy, unspecified: Secondary | ICD-10-CM | POA: Insufficient documentation

## 2015-09-11 DIAGNOSIS — M6281 Muscle weakness (generalized): Secondary | ICD-10-CM | POA: Insufficient documentation

## 2015-09-11 DIAGNOSIS — R29898 Other symptoms and signs involving the musculoskeletal system: Secondary | ICD-10-CM | POA: Diagnosis present

## 2015-09-11 DIAGNOSIS — R262 Difficulty in walking, not elsewhere classified: Secondary | ICD-10-CM | POA: Insufficient documentation

## 2015-09-11 DIAGNOSIS — M6289 Other specified disorders of muscle: Secondary | ICD-10-CM

## 2015-09-11 NOTE — Therapy (Signed)
[place] [facility] [address] [place], [place], [address] Phone: [phone]   Fax:  [phone]  Physical Therapy Treatment  Patient Details  Name: Patricia BladeDeborah Norman MRN: [id number] Date of Birth: 11/28/1950 Referring Provider: Dr. Ranee Gosselinonald Gioffre   Encounter Date: 09/11/2015      PT End of Session - 09/11/15 1552    Visit Number 3   Number of Visits 13   Date for PT Re-Evaluation 10/03/15   Authorization Type BCBS    Authorization Time Period 09/03/2015 to 10/15/2015   PT Start Time 1515   PT Stop Time 1556   PT Time Calculation (min) 41 min   Activity Tolerance Patient tolerated treatment well   Behavior During Therapy [facility] for tasks assessed/performed      Past Medical History  Diagnosis Date  . Hypertension   . Migraine headache   . Tobacco abuse   . Hyperlipidemia   . Chest pain   . History of constipation     Past Surgical History  Procedure Laterality Date  . Colonoscopy      DR. JOHN HAYES  . Partial hysterectomy    . Cardiovascular stress test  08/10/2009    EF 67%  . [place] echocardiography  12/15/2003    EF 55-60%    There were no vitals filed for this visit.  Visit Diagnosis:  Pain in left knee  Weakness of both lower extremities  Muscle tightness  Difficulty in walking, not elsewhere classified      Subjective Assessment - 09/11/15 1520    Subjective Pt states she has been performing her HEP regularly and has had no issues so far. She notes she is not having any pain today.    Pertinent History HTN, chest pain, hx of chronic L knee pain    Patient Stated Goals Pt's goal is to reduce her L knee pain and improve strength of her B LE in order to work in her garden.    Currently in Pain? No/denies   Pain Onset More than a month ago                         [facility]/Exercise - 09/11/15 0001    Knee/Hip Exercises: Stretches   Active Hamstring Stretch Both;3 reps;30 seconds   Active  Hamstring Stretch Limitations seated   Knee/Hip Exercises: Aerobic   Stationary Bike L2 x4 min   Knee/Hip Exercises: Standing   Heel Raises Both;15 reps  heel and toe raises   Knee/Hip Exercises: Supine   Bridges 2 sets;10 reps   Knee/Hip Exercises: Sidelying   Hip ABduction Both;2 sets;10 reps   Hip ABduction Limitations against wall                PT Education - 09/11/15 1552    Education provided Yes   Education Details reviewed and updated HEP   Person(s) Educated Patient   Methods Explanation;Demonstration;Handout   Comprehension Verbalized understanding;Returned demonstration          PT Short Term Goals - 09/03/15 1734    PT SHORT TERM GOAL #1   Title Patient will independently verbalize and demo full understanding with HEP in order to improve compliance and progress towards improved function.    Time 3   Period Weeks   Status New   PT SHORT TERM GOAL #2   Title Patient will demo improved L SLS to >20 seconds in order to improve L knee stabilization and  balance with long distance ambulation.    Time 3   Period Weeks   Status New   PT SHORT TERM GOAL #3   Title Patient will independently verbalize pain management strategies including use of heat therapy in order to reduce pain levels with prolonged standing.    Time 3   Period Weeks   Status New           PT Long Term Goals - 09/03/15 1738    PT LONG TERM GOAL #1   Title Patient will report decreased L knee pain to 0-1/10 on a VAS in order to improve tolerance with community ambulation to >45 minutes.    Time 6   Period Weeks   Status New   PT LONG TERM GOAL #2   Title Patient will improve B quad/HS strength to 5/5 MMT in order to improve L knee stabilization with gardening activities.    Time 6   Period Weeks   Status New   PT LONG TERM GOAL #3   Title Patient will improve B hip strength to >4/5 MMT in order to improve tolerance with prolonged standing for >45 minutes.    Time 6   Period  Weeks   Status New   PT LONG TERM GOAL #4   Title Patient will be able to squat to 75 degrees of knee flexion, pain-free, in order to return to household and yard work activities with less pain.    Time 6   Period Weeks   Status New   PT LONG TERM GOAL #5   Title Patient will improve her FOTO limitation score to <30% in order to progress towards her PLOF.    Time 6   Period Weeks   Status New               Plan - 09/11/15 1553    Clinical Impression Statement Session focused on LE strengthening activity. Pt demonstrating increased fatigue during exercise evident by muscle burning and cuing for correct technique. Pt is consistently performing her HEP and was understanding of all updates made this session. No report of increased pain this session. Will continue current POC.    Pt will benefit from skilled therapeutic intervention in order to improve on the following deficits Decreased strength;Difficulty walking;Impaired flexibility;Pain;Decreased balance   Rehab Potential Good   PT Frequency 2x / week   PT Duration 6 weeks   PT Treatment/Interventions ADLs/Self Care Home Management;Cryotherapy;Electrical Stimulation;Moist Heat;Ultrasound;Therapeutic activities;Therapeutic exercise;Neuromuscular re-education;Patient/family education;Manual techniques;Passive range of motion;Taping   PT Next Visit Plan gastroc/HA stretch; quad/hip strengthening, and manual techniques to reduce L knee pain; progress CKC exercises as able.     PT Home Exercise Plan Added bridges and abduction   Consulted and Agree with Plan of Care Patient        Problem List Patient Active Problem List   Diagnosis Date Noted  . Left knee pain 08/08/2015  . Hypokalemia 10/03/2013  . Sicca syndrome (HCC) 02/21/2011  . Tobacco abuse counseling 02/21/2011  . Benign hypertensive heart disease without heart failure 02/21/2011  . Dyslipidemia 02/21/2011  . ASTHMATIC BRONCHITIS, ACUTE 09/09/2007  . DYSPNEA  09/09/2007  . CHEST PAIN UNSPECIFIED 09/09/2007   4:01 PM,09/11/2015 Marylyn Ishihara PT, DPT [facility] [phone]  [facility] [address] [place], [place], [address] Phone: [phone]   Fax:  [phone]  Name: Patricia Norman MRN: [id number] Date of Birth: May 25, 1951

## 2015-09-13 ENCOUNTER — Ambulatory Visit (HOSPITAL_COMMUNITY): Payer: BC Managed Care – PPO | Admitting: Physical Therapy

## 2015-09-13 DIAGNOSIS — M25562 Pain in left knee: Secondary | ICD-10-CM

## 2015-09-13 DIAGNOSIS — R262 Difficulty in walking, not elsewhere classified: Secondary | ICD-10-CM

## 2015-09-13 DIAGNOSIS — R29898 Other symptoms and signs involving the musculoskeletal system: Secondary | ICD-10-CM

## 2015-09-13 NOTE — Therapy (Signed)
[facility] [address] [place], [place], [address] Phone: [phone]   Fax:  [phone]  Physical Therapy Treatment  Patient Details  Name: Patricia Norman MRN: [id number] Date of Birth: [birth year] Referring Provider: Dr. Ranee Gosselin   Encounter Date: 09/13/2015      PT End of Session - 09/13/15 1552    Visit Number 4   Number of Visits 13   Date for PT Re-Evaluation 10/03/15   Authorization Type BCBS    Authorization Time Period 09/03/2015 to 10/15/2015   PT Start Time 1517   PT Stop Time 1557   PT Time Calculation (min) 40 min   Activity Tolerance Patient tolerated treatment well   Behavior During Therapy [facility] for tasks assessed/performed      Past Medical History  Diagnosis Date  . Hypertension   . Migraine headache   . Tobacco abuse   . Hyperlipidemia   . Chest pain   . History of constipation     Past Surgical History  Procedure Laterality Date  . Colonoscopy      DR. JOHN HAYES  . Partial hysterectomy    . Cardiovascular stress test  08/10/2009    EF 67%  . US echocardiography  12/15/2003    EF 55-60%    There were no vitals filed for this visit.  Visit Diagnosis:  Pain in left knee - Plan: PT plan of care cert/re-cert  Weakness of both lower extremities - Plan: PT plan of care cert/re-cert  Other symptoms and signs involving the musculoskeletal system - Plan: PT plan of care cert/re-cert  Difficulty in walking, not elsewhere classified - Plan: PT plan of care cert/re-cert      Subjective Assessment - 09/13/15 1521    Subjective Pt states she was a little sore after her last session, but it only lasted for that evening. She feels the bike aggravated her knee. She feels a "little under the weather".    Pertinent History HTN, chest pain, hx of chronic L knee pain    Patient Stated Goals Pt's goal is to reduce her L knee pain and improve strength of her B LE in order to work in her garden.    Currently in  Pain? No/denies   Pain Onset More than a month ago                         [facility] Adult PT Treatment/Exercise - 09/13/15 0001    Knee/Hip Exercises: Stretches   Active Hamstring Stretch Both;3 reps;30 seconds   Active Hamstring Stretch Limitations seated   Knee/Hip Exercises: Standing   Heel Raises 20 reps;Both  heel/toe raises   Wall Squat 15 reps  with red TB around knees   Knee/Hip Exercises: Seated   Long Arc Quad Both;1 set;10 reps  red TB   Heel Slides Strengthening;Both;1 set;10 reps  green TB   Knee/Hip Exercises: Sidelying   Clams x10 with green TB                PT Education - 09/13/15 1551    Education provided Yes   Education Details updated HEP   Person(s) Educated Patient   Methods Explanation;Demonstration   Comprehension Verbalized understanding;Returned demonstration          PT Short Term Goals - 09/03/15 1734    PT SHORT TERM GOAL #1   Title Patient will independently verbalize and demo full understanding with HEP in order to improve compliance  and progress towards improved function.    Time 3   Period Weeks   Status New   PT SHORT TERM GOAL #2   Title Patient will demo improved L SLS to >20 seconds in order to improve L knee stabilization and balance with long distance ambulation.    Time 3   Period Weeks   Status New   PT SHORT TERM GOAL #3   Title Patient will independently verbalize pain management strategies including use of heat therapy in order to reduce pain levels with prolonged standing.    Time 3   Period Weeks   Status New           PT Long Term Goals - 09/03/15 1738    PT LONG TERM GOAL #1   Title Patient will report decreased L knee pain to 0-1/10 on a VAS in order to improve tolerance with community ambulation to >45 minutes.    Time 6   Period Weeks   Status New   PT LONG TERM GOAL #2   Title Patient will improve B quad/HS strength to 5/5 MMT in order to improve L knee stabilization with  gardening activities.    Time 6   Period Weeks   Status New   PT LONG TERM GOAL #3   Title Patient will improve B hip strength to >4/5 MMT in order to improve tolerance with prolonged standing for >45 minutes.    Time 6   Period Weeks   Status New   PT LONG TERM GOAL #4   Title Patient will be able to squat to 75 degrees of knee flexion, pain-free, in order to return to household and yard work activities with less pain.    Time 6   Period Weeks   Status New   PT LONG TERM GOAL #5   Title Patient will improve her FOTO limitation score to <30% in order to progress towards her PLOF.    Time 6   Period Weeks   Status New               Plan - 09/13/15 1557    Clinical Impression Statement Session continued to focus on LE strengthening activity without increasing pain report. Pt demonstrating Rt weight shift during wall squat activity secondary to pain and decreased strength of LLE. This improved with tactile cues from therapist without increase in pain. Will continue current POC.    Pt will benefit from skilled therapeutic intervention in order to improve on the following deficits Decreased strength;Difficulty walking;Impaired flexibility;Pain;Decreased balance   Rehab Potential Good   PT Frequency 2x / week   PT Duration 6 weeks   PT Treatment/Interventions ADLs/Self Care Home Management;Cryotherapy;Electrical Stimulation;Moist Heat;Ultrasound;Therapeutic activities;Therapeutic exercise;Neuromuscular re-education;Patient/family education;Manual techniques;Passive range of motion;Taping   PT Next Visit Plan gastroc/HA stretch; quad/hip strengthening, and manual techniques to reduce L knee pain; progress CKC exercises as able.     PT Home Exercise Plan added wall squat   Consulted and Agree with Plan of Care Patient        Problem List Patient Active Problem List   Diagnosis Date Noted  . Left knee pain 08/08/2015  . Hypokalemia 10/03/2013  . Sicca syndrome (HCC) 02/21/2011   . Tobacco abuse counseling 02/21/2011  . Benign hypertensive heart disease without heart failure 02/21/2011  . Dyslipidemia 02/21/2011  . ASTHMATIC BRONCHITIS, ACUTE 09/09/2007  . DYSPNEA 09/09/2007  . CHEST PAIN UNSPECIFIED 09/09/2007    5:00 PM,09/13/2015 Marylyn Ishihara PT, DPT [facility] [phone]  [facility] [address] [place], [place], [address] Phone: [phone]   Fax:  [phone]  Name: [name] MRN: [id number] Date of Birth: 03/03/1951

## 2015-09-18 ENCOUNTER — Encounter (HOSPITAL_COMMUNITY): Payer: BC Managed Care – PPO | Admitting: Physical Therapy

## 2015-09-20 ENCOUNTER — Ambulatory Visit (HOSPITAL_COMMUNITY): Payer: BC Managed Care – PPO | Admitting: Physical Therapy

## 2015-09-20 DIAGNOSIS — R262 Difficulty in walking, not elsewhere classified: Secondary | ICD-10-CM

## 2015-09-20 DIAGNOSIS — M6281 Muscle weakness (generalized): Secondary | ICD-10-CM

## 2015-09-20 DIAGNOSIS — M25562 Pain in left knee: Secondary | ICD-10-CM | POA: Diagnosis not present

## 2015-09-20 DIAGNOSIS — R29898 Other symptoms and signs involving the musculoskeletal system: Secondary | ICD-10-CM

## 2015-09-20 NOTE — Therapy (Signed)
[facility] [address] [place], [place], [address] Phone: [phone]   Fax:  [phone]  Physical Therapy Treatment (codes done)  Patient Details  Name: Patricia Norman MRN: [id number] Date of Birth: 06-16-1950 Referring Provider: Dr. Ranee Gosselin   Encounter Date: 09/20/2015      PT End of Session - 09/20/15 1711    Visit Number 5   Number of Visits 13   Date for PT Re-Evaluation 10/03/15   Authorization Type BCBS    Authorization Time Period 09/03/2015 to 10/15/2015   PT Start Time 1530  had to make an emergency call to another patient   PT Stop Time 1559   PT Time Calculation (min) 29 min   Activity Tolerance Patient tolerated treatment well   Behavior During Therapy [facility] for tasks assessed/performed      Past Medical History  Diagnosis Date  . Hypertension   . Migraine headache   . Tobacco abuse   . Hyperlipidemia   . Chest pain   . History of constipation     Past Surgical History  Procedure Laterality Date  . Colonoscopy      DR. JOHN HAYES  . Partial hysterectomy    . Cardiovascular stress test  08/10/2009    EF 67%  . US echocardiography  12/15/2003    EF 55-60%    There were no vitals filed for this visit.      Subjective Assessment - 09/20/15 1532    Subjective Patient arrived late today, thought that her appt was at 3:15 rather than 2:30; only having mild pain today    Pertinent History HTN, chest pain, hx of chronic L knee pain    Patient Stated Goals Pt's goal is to reduce her L knee pain and improve strength of her B LE in order to work in her garden.    Currently in Pain? Yes   Pain Score 1    Pain Location Knee   Pain Orientation Left   Pain Descriptors / Indicators Aching   Pain Type Chronic pain   Pain Radiating Towards none    Pain Onset More than a month ago   Pain Frequency Intermittent   Aggravating Factors  being up and on knee    Pain Relieving Factors getting off of feet    Effect  of Pain on Daily Activities does not stop her from doing anything    Multiple Pain Sites No                         [facility] Adult PT Treatment/Exercise - 09/20/15 0001    Knee/Hip Exercises: Stretches   Active Hamstring Stretch Both;3 reps;30 seconds   Active Hamstring Stretch Limitations standing 12 inch box    Gastroc Stretch Both;3 reps;30 seconds   Gastroc Stretch Limitations gastroc on slantboard    Other Knee/Hip Stretches attempted seated piriformis stretch; patient had trouble with form, so waived due to time today    Knee/Hip Exercises: Standing   Heel Raises Both;1 set;15 reps   Rocker Board 2 minutes   Rocker Board Limitations AP and lateral U HHA    SLS x2 each side, max of 15, approx 3-4 seconds on average each LE    Knee/Hip Exercises: Seated   Long Arc Quad Both;2 sets;10 reps   Long Arc Quad Weight 2 lbs.   Heel Slides Both;2 sets;10 reps   Heel Slides Limitations 2  PT Education - 09/20/15 1711    Education provided No          PT Short Term Goals - 09/03/15 1734    PT SHORT TERM GOAL #1   Title Patient will independently verbalize and demo full understanding with HEP in order to improve compliance and progress towards improved function.    Time 3   Period Weeks   Status New   PT SHORT TERM GOAL #2   Title Patient will demo improved L SLS to >20 seconds in order to improve L knee stabilization and balance with long distance ambulation.    Time 3   Period Weeks   Status New   PT SHORT TERM GOAL #3   Title Patient will independently verbalize pain management strategies including use of heat therapy in order to reduce pain levels with prolonged standing.    Time 3   Period Weeks   Status New           PT Long Term Goals - 09/03/15 1738    PT LONG TERM GOAL #1   Title Patient will report decreased L knee pain to 0-1/10 on a VAS in order to improve tolerance with community ambulation to >45 minutes.    Time 6    Period Weeks   Status New   PT LONG TERM GOAL #2   Title Patient will improve B quad/HS strength to 5/5 MMT in order to improve L knee stabilization with gardening activities.    Time 6   Period Weeks   Status New   PT LONG TERM GOAL #3   Title Patient will improve B hip strength to >4/5 MMT in order to improve tolerance with prolonged standing for >45 minutes.    Time 6   Period Weeks   Status New   PT LONG TERM GOAL #4   Title Patient will be able to squat to 75 degrees of knee flexion, pain-free, in order to return to household and yard work activities with less pain.    Time 6   Period Weeks   Status New   PT LONG TERM GOAL #5   Title Patient will improve her FOTO limitation score to <30% in order to progress towards her PLOF.    Time 6   Period Weeks   Status New               Plan - 09/20/15 1712    Clinical Impression Statement Focused on functional strengthening and strengtening today in seated and standing positions today; also introduced rocker board to promote weight shifting and bearing down through each LE today. Patient did require cues for form on exercises today. No increased pain at end of session however patient did voice surpirse at her difficulty with SLS today.    Rehab Potential Good   PT Frequency 2x / week   PT Duration 6 weeks   PT Treatment/Interventions ADLs/Self Care Home Management;Cryotherapy;Electrical Stimulation;Moist Heat;Ultrasound;Therapeutic activities;Therapeutic exercise;Neuromuscular re-education;Patient/family education;Manual techniques;Passive range of motion;Taping   PT Next Visit Plan gastroc/HA stretch; quad/hip strengthening, and manual techniques to reduce L knee pain; progress CKC exercises as able.     PT Home Exercise Plan added wall squat   Consulted and Agree with Plan of Care Patient      Patient will benefit from skilled therapeutic intervention in order to improve the following deficits and impairments:  Decreased  strength, Difficulty walking, Impaired flexibility, Pain, Decreased balance  Visit Diagnosis: Pain in left knee - Plan: PT  plan of care cert/re-cert  Muscle weakness (generalized) - Plan: PT plan of care cert/re-cert  Other symptoms and signs involving the musculoskeletal system - Plan: PT plan of care cert/re-cert  Difficulty in walking, not elsewhere classified - Plan: PT plan of care cert/re-cert     Problem List Patient Active Problem List   Diagnosis Date Noted  . Left knee pain 08/08/2015  . Hypokalemia 10/03/2013  . Sicca syndrome (HCC) 02/21/2011  . Tobacco abuse counseling 02/21/2011  . Benign hypertensive heart disease without heart failure 02/21/2011  . Dyslipidemia 02/21/2011  . ASTHMATIC BRONCHITIS, ACUTE 09/09/2007  . DYSPNEA 09/09/2007  . CHEST PAIN UNSPECIFIED 09/09/2007    Nedra HaiKristen Selina Tapper PT, DPT [phone]  [facility] [address] [place], [place], [address] Phone: [phone]   Fax:  [phone]  Name: Patricia Norman MRN: [id number] Date of Birth: 05/13/1951

## 2015-09-25 ENCOUNTER — Ambulatory Visit (HOSPITAL_COMMUNITY): Payer: BC Managed Care – PPO | Admitting: Physical Therapy

## 2015-09-25 DIAGNOSIS — M25562 Pain in left knee: Secondary | ICD-10-CM

## 2015-09-25 DIAGNOSIS — R29898 Other symptoms and signs involving the musculoskeletal system: Secondary | ICD-10-CM

## 2015-09-25 DIAGNOSIS — R262 Difficulty in walking, not elsewhere classified: Secondary | ICD-10-CM

## 2015-09-25 DIAGNOSIS — M6281 Muscle weakness (generalized): Secondary | ICD-10-CM

## 2015-09-25 NOTE — Therapy (Signed)
[place] [facility] [address] [place], [place], [address] Phone: [phone]   Fax:  [phone]  Physical Therapy Treatment  Patient Details  Name: Patricia Norman MRN: [id number] Date of Birth: June 06, 1951 Referring Provider: Dr. Ranee Gosselin   Encounter Date: 09/25/2015      PT End of Session - 09/25/15 1548    Visit Number 6   Number of Visits 13   Date for PT Re-Evaluation 10/03/15   Authorization Type BCBS    Authorization Time Period 09/03/2015 to 10/15/2015   PT Start Time 1515   PT Stop Time 1600   PT Time Calculation (min) 45 min   Equipment Utilized During Treatment Gait belt   Activity Tolerance Patient tolerated treatment well   Behavior During Therapy [facility] for tasks assessed/performed      Past Medical History  Diagnosis Date  . Hypertension   . Migraine headache   . Tobacco abuse   . Hyperlipidemia   . Chest pain   . History of constipation     Past Surgical History  Procedure Laterality Date  . Colonoscopy      DR. JOHN HAYES  . Partial hysterectomy    . Cardiovascular stress test  08/10/2009    EF 67%  . US echocardiography  12/15/2003    EF 55-60%    There were no vitals filed for this visit.      Subjective Assessment - 09/25/15 1524    Subjective Pt states she is doing good today. No pain today and she has been doing her HEP some at home.    Pertinent History HTN, chest pain, hx of chronic L knee pain    Currently in Pain? No/denies                         [facility] Adult PT Treatment/Exercise - 09/25/15 0001    Knee/Hip Exercises: Stretches   Active Hamstring Stretch Both;3 reps;30 seconds   Active Hamstring Stretch Limitations standing 12 inch box    Knee/Hip Exercises: Standing   Wall Squat 10 reps;2 sets   Wall Squat Limitations green TB around knees   Rocker Board 2 minutes   Rocker Board Limitations AP, L/R 2 finger support   SLS x2 sets eaceh L: 12 sec max, R: 6 sec   SLS with  Vectors R SLS with LLE cone taps x2 rounds with 2 finger support, x2 rounds CGA, no UE support   Knee/Hip Exercises: Supine   Bridges 2 sets;10 reps                PT Education - 09/25/15 1547    Education provided Yes   Education Details reviewed HEP and importance of HEP adherence   Person(s) Educated Patient   Methods Explanation;Demonstration   Comprehension Verbalized understanding;Returned demonstration          PT Short Term Goals - 09/03/15 1734    PT SHORT TERM GOAL #1   Title Patient will independently verbalize and demo full understanding with HEP in order to improve compliance and progress towards improved function.    Time 3   Period Weeks   Status New   PT SHORT TERM GOAL #2   Title Patient will demo improved L SLS to >20 seconds in order to improve L knee stabilization and balance with long distance ambulation.    Time 3   Period Weeks   Status New   PT SHORT TERM GOAL #3  Title Patient will independently verbalize pain management strategies including use of heat therapy in order to reduce pain levels with prolonged standing.    Time 3   Period Weeks   Status New           PT Long Term Goals - 09/03/15 1738    PT LONG TERM GOAL #1   Title Patient will report decreased L knee pain to 0-1/10 on a VAS in order to improve tolerance with community ambulation to >45 minutes.    Time 6   Period Weeks   Status New   PT LONG TERM GOAL #2   Title Patient will improve B quad/HS strength to 5/5 MMT in order to improve L knee stabilization with gardening activities.    Time 6   Period Weeks   Status New   PT LONG TERM GOAL #3   Title Patient will improve B hip strength to >4/5 MMT in order to improve tolerance with prolonged standing for >45 minutes.    Time 6   Period Weeks   Status New   PT LONG TERM GOAL #4   Title Patient will be able to squat to 75 degrees of knee flexion, pain-free, in order to return to household and yard work activities with  less pain.    Time 6   Period Weeks   Status New   PT LONG TERM GOAL #5   Title Patient will improve her FOTO limitation score to <30% in order to progress towards her PLOF.    Time 6   Period Weeks   Status New               Plan - 09/25/15 1601    Clinical Impression Statement Today's session continued to focus on LE strength and balance activity. Pt demonstrating increased trunk flexion and difficulty with Rt SLS however her technique improved with therapist cuing during the session. She tolerated today's treatment well without increase in knee pain. Will continue with current POC.    Rehab Potential Good   PT Frequency 2x / week   PT Duration 6 weeks   PT Treatment/Interventions ADLs/Self Care Home Management;Cryotherapy;Electrical Stimulation;Moist Heat;Ultrasound;Therapeutic activities;Therapeutic exercise;Neuromuscular re-education;Patient/family education;Manual techniques;Passive range of motion;Taping   PT Next Visit Plan gastroc/HA stretch; quad/hip strengthening, and manual techniques to reduce L knee pain; progress CKC exercises as able.     PT Home Exercise Plan no additions this vist; update printed HEP next visit   Consulted and Agree with Plan of Care Patient      Patient will benefit from skilled therapeutic intervention in order to improve the following deficits and impairments:  Decreased strength, Difficulty walking, Impaired flexibility, Pain, Decreased balance  Visit Diagnosis: Pain in left knee  Muscle weakness (generalized)  Other symptoms and signs involving the musculoskeletal system  Difficulty in walking, not elsewhere classified     Problem List Patient Active Problem List   Diagnosis Date Noted  . Left knee pain 08/08/2015  . Hypokalemia 10/03/2013  . Sicca syndrome (HCC) 02/21/2011  . Tobacco abuse counseling 02/21/2011  . Benign hypertensive heart disease without heart failure 02/21/2011  . Dyslipidemia 02/21/2011  . ASTHMATIC  BRONCHITIS, ACUTE 09/09/2007  . DYSPNEA 09/09/2007  . CHEST PAIN UNSPECIFIED 09/09/2007   4:09 PM,09/25/2015 Marylyn IshiharaSara Kiser PT, DPT [facility] [phone]  [facility] [address] [place], [place], [address] Phone: [phone]   Fax:  [phone]  Name: Patricia BladeDeborah Biggs MRN: [id number] Date of  Birth: Apr 24, 1951

## 2015-09-27 ENCOUNTER — Telehealth (HOSPITAL_COMMUNITY): Payer: Self-pay

## 2015-09-27 ENCOUNTER — Ambulatory Visit (HOSPITAL_COMMUNITY): Payer: BC Managed Care – PPO | Admitting: Physical Therapy

## 2015-09-27 NOTE — Telephone Encounter (Signed)
09/27/15 cx - thinks she has a stomach virus and can't come today

## 2015-10-02 ENCOUNTER — Telehealth (HOSPITAL_COMMUNITY): Payer: Self-pay

## 2015-10-02 ENCOUNTER — Ambulatory Visit (HOSPITAL_COMMUNITY): Payer: BC Managed Care – PPO | Admitting: Physical Therapy

## 2015-10-02 NOTE — Telephone Encounter (Signed)
10/02/15 left a message to cancel this weeks appts, her daughter is having a baby and going to help her.

## 2015-10-04 ENCOUNTER — Ambulatory Visit (HOSPITAL_COMMUNITY): Payer: BC Managed Care – PPO | Admitting: Physical Therapy

## 2015-10-09 ENCOUNTER — Ambulatory Visit (HOSPITAL_COMMUNITY): Payer: BC Managed Care – PPO | Admitting: Physical Therapy

## 2015-10-09 ENCOUNTER — Telehealth (HOSPITAL_COMMUNITY): Payer: Self-pay

## 2015-10-09 NOTE — Telephone Encounter (Signed)
10/09/15 cx this week - said she was still in [place]... Baby was having complications

## 2015-10-11 ENCOUNTER — Encounter (HOSPITAL_COMMUNITY): Payer: BC Managed Care – PPO | Admitting: Physical Therapy

## 2015-10-16 ENCOUNTER — Ambulatory Visit (HOSPITAL_COMMUNITY): Payer: BC Managed Care – PPO | Admitting: Physical Therapy

## 2015-10-16 ENCOUNTER — Telehealth (HOSPITAL_COMMUNITY): Payer: Self-pay | Admitting: Physical Therapy

## 2015-10-16 NOTE — Telephone Encounter (Signed)
No answer, LMOM. Notified pt of missed appt this evening. She is not scheduled after today, so therapis provided contact # and encouraged her to call and reschedule another appt.    3:56 PM,10/16/2015 Marylyn IshiharaSara Kiser PT, DPT [facility] [phone]

## 2015-10-23 ENCOUNTER — Telehealth (HOSPITAL_COMMUNITY): Payer: Self-pay | Admitting: Physical Therapy

## 2015-10-23 ENCOUNTER — Ambulatory Visit (HOSPITAL_COMMUNITY): Payer: BC Managed Care – PPO | Attending: Orthopedic Surgery | Admitting: Physical Therapy

## 2015-10-23 NOTE — Telephone Encounter (Signed)
Reminded pt of missed apt and left # and encouraged her to call to set up another apt for her reassessment.    5:22 PM,10/23/2015 Marylyn IshiharaSara Kiser PT, DPT [facility] [phone]

## 2015-10-25 ENCOUNTER — Telehealth (HOSPITAL_COMMUNITY): Payer: Self-pay

## 2015-10-25 NOTE — Telephone Encounter (Signed)
518/17 she left a message to cx 5/18 appt but she didn't have an appt.... The last appt on her schedule was 5/16 but she didn't show

## 2015-12-05 ENCOUNTER — Telehealth: Payer: Self-pay | Admitting: *Deleted

## 2015-12-05 NOTE — Telephone Encounter (Signed)
[facility] Cevimeline [facility]. Insurance company will not cover medication Patient will need to follow up with PCP regarding this medication Left message to call back

## 2015-12-10 ENCOUNTER — Telehealth: Payer: Self-pay

## 2015-12-10 NOTE — Telephone Encounter (Signed)
Left message for pt, according to the last office note the pt was following up prn with cardiology. Advised pt to contact her PCP for refills.

## 2015-12-10 NOTE — Telephone Encounter (Signed)
Pt called stating she was a former pt of Dr. Patty SermonsBrackbill and that she is running out of her Simvastatin and has been out of Toprol XL for 2 days. Please send refills to [facility] on [address] [place]. Pt requested a call back to let her know these have been refilled. Thanks

## 2015-12-13 ENCOUNTER — Other Ambulatory Visit: Payer: Self-pay | Admitting: *Deleted

## 2015-12-13 NOTE — Telephone Encounter (Signed)
error 

## 2015-12-13 NOTE — Telephone Encounter (Signed)
called back, wanting these medications filled, she states she does not have a PCP, please call her back

## 2015-12-18 NOTE — Telephone Encounter (Signed)
Left message to call back  

## 2015-12-21 NOTE — Telephone Encounter (Signed)
Spoke with patient and her PCP left the practice he was with. Patient will find another PCP. She was seen in February for her yearly physical and will call the office of PCP for refills. If unable to get will go to Urgent Care 

## 2015-12-21 NOTE — Telephone Encounter (Signed)
Spoke with patient and her PCP left the practice he was with. Patient will find another PCP. She was seen in February for her yearly physical and will call the office of PCP for refills. If unable to get will go to Urgent Care

## 2016-03-31 ENCOUNTER — Inpatient Hospital Stay (HOSPITAL_COMMUNITY): Payer: Medicare Other

## 2016-03-31 ENCOUNTER — Inpatient Hospital Stay (HOSPITAL_COMMUNITY)
Admission: EM | Admit: 2016-03-31 | Discharge: 2016-04-01 | DRG: 066 | Disposition: A | Discharge status: Home / Self Care | Payer: Medicare Other | Attending: Internal Medicine | Admitting: Internal Medicine

## 2016-03-31 ENCOUNTER — Encounter (HOSPITAL_COMMUNITY): Payer: Self-pay | Admitting: Emergency Medicine

## 2016-03-31 ENCOUNTER — Emergency Department (HOSPITAL_COMMUNITY): Payer: Medicare Other

## 2016-03-31 DIAGNOSIS — I63412 Cerebral infarction due to embolism of left middle cerebral artery: Secondary | ICD-10-CM | POA: Diagnosis not present

## 2016-03-31 DIAGNOSIS — Z87891 Personal history of nicotine dependence: Secondary | ICD-10-CM | POA: Diagnosis not present

## 2016-03-31 DIAGNOSIS — I639 Cerebral infarction, unspecified: Secondary | ICD-10-CM

## 2016-03-31 DIAGNOSIS — Z91040 Latex allergy status: Secondary | ICD-10-CM

## 2016-03-31 DIAGNOSIS — R471 Dysarthria and anarthria: Secondary | ICD-10-CM | POA: Diagnosis present

## 2016-03-31 DIAGNOSIS — Z79890 Hormone replacement therapy (postmenopausal): Secondary | ICD-10-CM | POA: Diagnosis not present

## 2016-03-31 DIAGNOSIS — Z79899 Other long term (current) drug therapy: Secondary | ICD-10-CM

## 2016-03-31 DIAGNOSIS — Z888 Allergy status to other drugs, medicaments and biological substances status: Secondary | ICD-10-CM | POA: Diagnosis not present

## 2016-03-31 DIAGNOSIS — Z8249 Family history of ischemic heart disease and other diseases of the circulatory system: Secondary | ICD-10-CM | POA: Diagnosis not present

## 2016-03-31 DIAGNOSIS — I672 Cerebral atherosclerosis: Secondary | ICD-10-CM | POA: Diagnosis present

## 2016-03-31 DIAGNOSIS — R41841 Cognitive communication deficit: Secondary | ICD-10-CM

## 2016-03-31 DIAGNOSIS — I6340 Cerebral infarction due to embolism of unspecified cerebral artery: Secondary | ICD-10-CM

## 2016-03-31 DIAGNOSIS — F329 Major depressive disorder, single episode, unspecified: Secondary | ICD-10-CM | POA: Diagnosis present

## 2016-03-31 DIAGNOSIS — R29706 NIHSS score 6: Secondary | ICD-10-CM | POA: Diagnosis present

## 2016-03-31 DIAGNOSIS — I63519 Cerebral infarction due to unspecified occlusion or stenosis of unspecified middle cerebral artery: Principal | ICD-10-CM | POA: Diagnosis present

## 2016-03-31 DIAGNOSIS — H538 Other visual disturbances: Secondary | ICD-10-CM | POA: Diagnosis present

## 2016-03-31 DIAGNOSIS — Z23 Encounter for immunization: Secondary | ICD-10-CM

## 2016-03-31 DIAGNOSIS — E876 Hypokalemia: Secondary | ICD-10-CM | POA: Diagnosis present

## 2016-03-31 DIAGNOSIS — R778 Other specified abnormalities of plasma proteins: Secondary | ICD-10-CM | POA: Diagnosis present

## 2016-03-31 DIAGNOSIS — I671 Cerebral aneurysm, nonruptured: Secondary | ICD-10-CM | POA: Diagnosis present

## 2016-03-31 DIAGNOSIS — G43909 Migraine, unspecified, not intractable, without status migrainosus: Secondary | ICD-10-CM | POA: Diagnosis present

## 2016-03-31 DIAGNOSIS — R2689 Other abnormalities of gait and mobility: Secondary | ICD-10-CM

## 2016-03-31 DIAGNOSIS — E785 Hyperlipidemia, unspecified: Secondary | ICD-10-CM | POA: Diagnosis present

## 2016-03-31 DIAGNOSIS — I10 Essential (primary) hypertension: Secondary | ICD-10-CM | POA: Diagnosis present

## 2016-03-31 DIAGNOSIS — R531 Weakness: Secondary | ICD-10-CM | POA: Diagnosis present

## 2016-03-31 HISTORY — DX: Depression, unspecified: F32.A

## 2016-03-31 HISTORY — DX: Major depressive disorder, single episode, unspecified: F32.9

## 2016-03-31 LAB — RAPID URINE DRUG SCREEN, HOSP PERFORMED
Amphetamines: NOT DETECTED
BARBITURATES: NOT DETECTED
Benzodiazepines: POSITIVE — AB
COCAINE: NOT DETECTED
Opiates: NOT DETECTED
TETRAHYDROCANNABINOL: NOT DETECTED

## 2016-03-31 LAB — ECHOCARDIOGRAM COMPLETE
CHL CUP DOP CALC LVOT VTI: 20 cm
CHL CUP RV SYS PRESS: 26 mmHg
E/e' ratio: 11.34
EWDT: 225 ms
FS: 30 % (ref 28–44)
HEIGHTINCHES: 58 in
IVS/LV PW RATIO, ED: 1.12
LA ID, A-P, ES: 36 mm
LA diam end sys: 36 mm
LA vol index: 21.2 mL/m2
LA vol: 33.9 mL
LADIAMINDEX: 2.25 cm/m2
LAVOLA4C: 27.1 mL
LV E/e' medial: 11.34
LV E/e'average: 11.34
LV SIMPSON'S DISK: 58
LV TDI E'LATERAL: 7.94
LV TDI E'MEDIAL: 6.09
LV dias vol index: 29 mL/m2
LV dias vol: 46 mL (ref 46–106)
LVELAT: 7.94 cm/s
LVOT SV: 57 mL
LVOT area: 2.84 cm2
LVOT peak grad rest: 5 mmHg
LVOT peak vel: 107 cm/s
LVOTD: 19 mm
LVSYSVOL: 19 mL (ref 14–42)
LVSYSVOLIN: 12 mL/m2
MV Dec: 225
MV Peak grad: 3 mmHg
MV pk A vel: 110 m/s
MVPKEVEL: 90 m/s
PW: 9.17 mm — AB (ref 0.6–1.1)
RV LATERAL S' VELOCITY: 13.4 cm/s
RV TAPSE: 23.4 mm
Reg peak vel: 241 cm/s
Stroke v: 27 ml
TRMAXVEL: 241 cm/s
WEIGHTICAEL: 2144 [oz_av]

## 2016-03-31 LAB — CBC
HCT: 36.1 % (ref 36.0–46.0)
HCT: 36.2 % (ref 36.0–46.0)
HEMOGLOBIN: 12.1 g/dL (ref 12.0–15.0)
Hemoglobin: 12.1 g/dL (ref 12.0–15.0)
MCH: 30.8 pg (ref 26.0–34.0)
MCH: 30.9 pg (ref 26.0–34.0)
MCHC: 33.5 g/dL (ref 30.0–36.0)
MCHC: 33.4 g/dL (ref 30.0–36.0)
MCV: 91.9 fL (ref 78.0–100.0)
MCV: 92.3 fL (ref 78.0–100.0)
PLATELETS: 135 10*3/uL — AB (ref 150–400)
PLATELETS: 144 10*3/uL — AB (ref 150–400)
RBC: 3.93 MIL/uL (ref 3.87–5.11)
RBC: 3.92 MIL/uL (ref 3.87–5.11)
RDW: 12.5 % (ref 11.5–15.5)
RDW: 12.7 % (ref 11.5–15.5)
WBC: 8 10*3/uL (ref 4.0–10.5)
WBC: 6.5 10*3/uL (ref 4.0–10.5)

## 2016-03-31 LAB — TSH: TSH: 2.889 u[IU]/mL (ref 0.350–4.500)

## 2016-03-31 LAB — COMPREHENSIVE METABOLIC PANEL
ALBUMIN: 4.1 g/dL (ref 3.5–5.0)
ALK PHOS: 49 U/L (ref 38–126)
ALT: 14 U/L (ref 14–54)
AST: 24 U/L (ref 15–41)
Anion gap: 5 (ref 5–15)
BUN: 14 mg/dL (ref 6–20)
CHLORIDE: 104 mmol/L (ref 101–111)
CO2: 29 mmol/L (ref 22–32)
CREATININE: 1.05 mg/dL — AB (ref 0.44–1.00)
Calcium: 9.1 mg/dL (ref 8.9–10.3)
GFR calc non Af Amer: 55 mL/min — ABNORMAL LOW (ref >60)
GLUCOSE: 87 mg/dL (ref 65–99)
Potassium: 2.9 mmol/L — ABNORMAL LOW (ref 3.5–5.1)
SODIUM: 138 mmol/L (ref 135–145)
Total Bilirubin: 0.5 mg/dL (ref 0.3–1.2)
Total Protein: 7.4 g/dL (ref 6.5–8.1)

## 2016-03-31 LAB — TROPONIN I
TROPONIN I: 0.07 ng/mL — AB (ref <0.03)
TROPONIN I: 0.07 ng/mL — AB (ref <0.03)
TROPONIN I: 0.05 ng/mL — AB (ref <0.03)
TROPONIN I: 0.03 ng/mL — AB (ref <0.03)

## 2016-03-31 LAB — ETHANOL: ALCOHOL ETHYL (B): 7 mg/dL — AB (ref <5)

## 2016-03-31 LAB — DIFFERENTIAL
BASOS ABS: 0 10*3/uL (ref 0.0–0.1)
BASOS PCT: 1 %
Eosinophils Absolute: 0 10*3/uL (ref 0.0–0.7)
Eosinophils Relative: 0 %
LYMPHS PCT: 18 %
Lymphs Abs: 1.4 10*3/uL (ref 0.7–4.0)
Monocytes Absolute: 0.6 10*3/uL (ref 0.1–1.0)
Monocytes Relative: 8 %
NEUTROS ABS: 5.8 10*3/uL (ref 1.7–7.7)
NEUTROS PCT: 73 %

## 2016-03-31 LAB — APTT: APTT: 41 s — AB (ref 24–36)

## 2016-03-31 LAB — URINALYSIS, ROUTINE W REFLEX MICROSCOPIC
Bilirubin Urine: NEGATIVE
GLUCOSE, UA: NEGATIVE mg/dL
Hgb urine dipstick: NEGATIVE
KETONES UR: NEGATIVE mg/dL
LEUKOCYTES UA: NEGATIVE
Nitrite: NEGATIVE
PROTEIN: NEGATIVE mg/dL
Specific Gravity, Urine: <1.005 — ABNORMAL LOW (ref 1.005–1.030)
pH: 5.5 (ref 5.0–8.0)

## 2016-03-31 LAB — VITAMIN B12: Vitamin B-12: 446 pg/mL (ref 180–914)

## 2016-03-31 LAB — CBG MONITORING, ED: Glucose-Capillary: 124 mg/dL — ABNORMAL HIGH (ref 65–99)

## 2016-03-31 LAB — CREATININE, SERUM
Creatinine, Ser: 0.91 mg/dL (ref 0.44–1.00)
GFR calc non Af Amer: >60 mL/min (ref >60)

## 2016-03-31 LAB — PROTIME-INR
INR: 0.96
PROTHROMBIN TIME: 12.8 s (ref 11.4–15.2)

## 2016-03-31 LAB — MAGNESIUM: Magnesium: 2.2 mg/dL (ref 1.7–2.4)

## 2016-03-31 MED ORDER — ASPIRIN 81 MG PO CHEW
324.0000 mg | CHEWABLE_TABLET | Freq: Once | ORAL | Status: AC
  Administered 2016-03-31: 324 mg via ORAL
  Filled 2016-03-31: qty 4

## 2016-03-31 MED ORDER — SIMVASTATIN 20 MG PO TABS
20.0000 mg | ORAL_TABLET | Freq: Every day | ORAL | Status: DC
  Administered 2016-03-31: 20 mg via ORAL
  Filled 2016-03-31: qty 1

## 2016-03-31 MED ORDER — CEVIMELINE HCL 30 MG PO CAPS
30.0000 mg | ORAL_CAPSULE | Freq: Three times a day (TID) | ORAL | Status: DC
  Administered 2016-03-31 (×2): 30 mg via ORAL
  Filled 2016-03-31: qty 1

## 2016-03-31 MED ORDER — TRAZODONE HCL 50 MG PO TABS
50.0000 mg | ORAL_TABLET | Freq: Every day | ORAL | Status: DC
  Filled 2016-03-31: qty 1

## 2016-03-31 MED ORDER — CYCLOBENZAPRINE HCL 10 MG PO TABS: 10.0000 mg | ORAL_TABLET | Freq: Three times a day (TID) | ORAL | Status: DC | PRN

## 2016-03-31 MED ORDER — ALPRAZOLAM 0.5 MG PO TABS
0.5000 mg | ORAL_TABLET | Freq: Two times a day (BID) | ORAL | Status: DC | PRN
  Administered 2016-03-31: 0.5 mg via ORAL
  Filled 2016-03-31: qty 1

## 2016-03-31 MED ORDER — CLOPIDOGREL BISULFATE 75 MG PO TABS
75.0000 mg | ORAL_TABLET | Freq: Every day | ORAL | Status: DC
  Administered 2016-04-01: 75 mg via ORAL
  Filled 2016-03-31: qty 1

## 2016-03-31 MED ORDER — POTASSIUM CHLORIDE 10 MEQ/100ML IV SOLN
10.0000 meq | INTRAVENOUS | Status: AC
  Administered 2016-03-31 (×2): 10 meq via INTRAVENOUS
  Filled 2016-03-31 (×2): qty 100

## 2016-03-31 MED ORDER — TRAZODONE HCL 50 MG PO TABS
50.0000 mg | ORAL_TABLET | Freq: Every day | ORAL | Status: DC
  Administered 2016-03-31: 50 mg via ORAL
  Filled 2016-03-31: qty 1

## 2016-03-31 MED ORDER — METOPROLOL SUCCINATE ER 25 MG PO TB24
25.0000 mg | ORAL_TABLET | Freq: Every day | ORAL | Status: DC
  Administered 2016-03-31 – 2016-04-01 (×2): 25 mg via ORAL
  Filled 2016-03-31 (×2): qty 1

## 2016-03-31 MED ORDER — ENOXAPARIN SODIUM 40 MG/0.4ML ~~LOC~~ SOLN
40.0000 mg | SUBCUTANEOUS | Status: DC
  Administered 2016-03-31 – 2016-04-01 (×2): 40 mg via SUBCUTANEOUS
  Filled 2016-03-31 (×2): qty 0.4

## 2016-03-31 MED ORDER — INFLUENZA VAC SPLIT QUAD 0.5 ML IM SUSY
0.5000 mL | PREFILLED_SYRINGE | INTRAMUSCULAR | Status: AC
  Administered 2016-04-01: 0.5 mL via INTRAMUSCULAR
  Filled 2016-03-31: qty 0.5

## 2016-03-31 MED ORDER — ASPIRIN 325 MG PO TABS
325.0000 mg | ORAL_TABLET | Freq: Every day | ORAL | Status: DC
  Administered 2016-03-31 – 2016-04-01 (×2): 325 mg via ORAL
  Filled 2016-03-31 (×2): qty 1

## 2016-03-31 MED ORDER — STROKE: EARLY STAGES OF RECOVERY BOOK
Freq: Once | Status: AC
  Administered 2016-03-31: 12:00:00
  Filled 2016-03-31: qty 1

## 2016-03-31 MED ORDER — FLUTICASONE PROPIONATE 50 MCG/ACT NA SUSP: 1.0000 | Freq: Every day | NASAL | Status: DC | PRN

## 2016-03-31 MED ORDER — SODIUM CHLORIDE 0.9 % IV SOLN: INTRAVENOUS | Status: DC

## 2016-03-31 MED ORDER — SERTRALINE HCL 50 MG PO TABS
150.0000 mg | ORAL_TABLET | Freq: Every day | ORAL | Status: DC
  Administered 2016-03-31 – 2016-04-01 (×2): 150 mg via ORAL
  Filled 2016-03-31 (×2): qty 3

## 2016-03-31 MED ORDER — POTASSIUM CHLORIDE CRYS ER 20 MEQ PO TBCR
40.0000 meq | EXTENDED_RELEASE_TABLET | ORAL | Status: AC
  Administered 2016-03-31 (×2): 40 meq via ORAL
  Filled 2016-03-31 (×2): qty 2

## 2016-03-31 MED ORDER — ASPIRIN 300 MG RE SUPP
300.0000 mg | Freq: Every day | RECTAL | Status: DC
  Filled 2016-03-31: qty 1

## 2016-03-31 MED ORDER — AMLODIPINE BESYLATE 5 MG PO TABS
5.0000 mg | ORAL_TABLET | Freq: Every day | ORAL | Status: DC
  Administered 2016-03-31 – 2016-04-01 (×2): 5 mg via ORAL
  Filled 2016-03-31 (×2): qty 1

## 2016-03-31 MED ORDER — SENNOSIDES-DOCUSATE SODIUM 8.6-50 MG PO TABS: 1.0000 | ORAL_TABLET | Freq: Every evening | ORAL | Status: DC | PRN

## 2016-03-31 MED ORDER — OXYBUTYNIN CHLORIDE ER 5 MG PO TB24
10.0000 mg | ORAL_TABLET | Freq: Every day | ORAL | Status: DC
  Administered 2016-03-31 – 2016-04-01 (×2): 10 mg via ORAL
  Filled 2016-03-31 (×2): qty 2

## 2016-03-31 MED ORDER — PANTOPRAZOLE SODIUM 40 MG PO TBEC
40.0000 mg | DELAYED_RELEASE_TABLET | Freq: Every day | ORAL | Status: DC
  Administered 2016-03-31 – 2016-04-01 (×2): 40 mg via ORAL
  Filled 2016-03-31 (×2): qty 1

## 2016-03-31 MED ORDER — LORATADINE 10 MG PO TABS: 10.0000 mg | ORAL_TABLET | ORAL | Status: DC | PRN

## 2016-03-31 MED ORDER — PNEUMOCOCCAL VAC POLYVALENT 25 MCG/0.5ML IJ INJ
0.5000 mL | INJECTION | INTRAMUSCULAR | Status: AC
  Administered 2016-04-01: 0.5 mL via INTRAMUSCULAR
  Filled 2016-03-31: qty 0.5

## 2016-03-31 NOTE — Progress Notes (Signed)
OT evaluation was started and was terminated early due to transport arriving to take patient to MRI and ultrasound. Evaluation will be completed/finished at a later time when able. Time spend with patient: 8:35-8:53  Limmie PatriciaLaura Ysabelle Goodroe, OTR/L,CBIS  [phone]

## 2016-03-31 NOTE — H&P (Addendum)
TRH H&P    Patient Demographics:    Patricia BladeDeborah Norman, is a 65 y.o. female  MRN: [id number]  DOB - 04/20/1951  Admit Date - 03/31/2016  Referring MD/NP/PA: Dr. Elesa MassedWard  Outpatient Primary MD for the patient is No PCP Per Patient  Patient coming from: Home  Chief Complaint  Patient presents with  . Cerebrovascular Accident      HPI:    Patricia BladeDeborah Norman  is a 65 y.o. female, with history of hypertension, hyperlipidemia, tobacco use quit 1 year ago came to the hospital with chief complaint of difficulty moving right arm since Friday around 3 AM, the weakness of arm improved but patient also had intermittent blurry vision with slurred speech. Denies numbness or tingling. No other focal weakness. The patient told about her symptoms to a friend she was concerned about stroke and culture to come to hospital for further evaluation. Patient does not have previous history of stroke or TIA.  In the ED CT head was done which showed acute nonhemorrhagic left MCA/insular territory infarct.  Patient denies chest pain, no shortness of breath. No nausea vomiting or diarrhea. No fever. No dysuria   Review of systems:    In addition to the HPI above,  No Fever-chills, No Headache, No changes with Vision or hearing, No problems swallowing food or Liquids, No Chest pain, Cough or Shortness of Breath, No Abdominal pain, No Nausea or Vomiting, bowel movements are regular, No Blood in stool or Urine, No dysuria, No new skin rashes or bruises, No new joints pains-aches,  No new weakness, tingling, numbness in any extremity, No recent weight gain or loss, No polyuria, polydypsia or polyphagia, No significant Mental Stressors.  A full 10 point Review of Systems was done, except as stated above, all other Review of Systems were negative.   With Past History of the following :    Past Medical History:  Diagnosis Date  .  Chest pain   . Depression   . History of constipation   . Hyperlipidemia   . Hypertension   . Migraine headache   . Tobacco abuse       Past Surgical History:  Procedure Laterality Date  . CARDIOVASCULAR STRESS TEST  08/10/2009   EF 67%  . CHOLECYSTECTOMY    . COLONOSCOPY     DR. Dorena CookeyJOHN HAYES  . PARTIAL HYSTERECTOMY    . US ECHOCARDIOGRAPHY  12/15/2003   EF 55-60%      Social History:      Social History  Substance Use Topics  . Smoking status: Former Smoker    Packs/day: 0.50  . Smokeless tobacco: Never Used  . Alcohol use No       Family History :     Family History  Problem Relation Age of Onset  . Hypertension Mother   . Angina Father       Home Medications:   Prior to Admission medications   Medication Sig Start Date End Date Taking? Authorizing Provider  ALPRAZolam Prudy Feeler(XANAX) 0.5 MG tablet Take 0.5 mg by mouth 2 (two)  times daily as needed for anxiety.    Historical Provider, MD  amLODipine (NORVASC) 5 MG tablet Take 1 tablet (5 mg total) by mouth daily. 08/08/15   Cassell Clement, MD  aspirin-acetaminophen-caffeine (EXCEDRIN MIGRAINE) [id number] MG per tablet Take 1 tablet by mouth every 6 (six) hours as needed for headache.     Historical Provider, MD  Calcium Carbonate (CALCIUM 500 PO) Take 1,000 mg by mouth daily.      Historical Provider, MD  cevimeline (EVOXAC) 30 MG capsule Take 1 capsule (30 mg total) by mouth 3 (three) times daily. 11/14/14   Cassell Clement, MD  cholecalciferol (VITAMIN D) 1000 UNITS tablet Take 1,000 Units by mouth daily.     Historical Provider, MD  cyclobenzaprine (FLEXERIL) 10 MG tablet TAKE 1 TABLET BY MOUTH 3 TIMES A DAY AS NEEDED FOR MUSCLE SPASMS 08/02/15   Cassell Clement, MD  estradiol (ESTRACE) 1 MG tablet Take 1 mg by mouth daily. 05/07/15   Historical Provider, MD  famciclovir (FAMVIR) 500 MG tablet Take 3 tablets by mouth as directed. Take 3 tablets by mouth once daily at onset of fever blisters 07/30/15   Historical  Provider, MD  fluticasone (FLONASE) 50 MCG/ACT nasal spray Place 1 spray into both nostrils daily as needed for allergies or rhinitis.    Historical Provider, MD  hydrochlorothiazide (MICROZIDE) 12.5 MG capsule TAKE ONE CAPSULE BY MOUTH DAILY 08/02/15   Cassell Clement, MD  loratadine (CLARITIN) 10 MG tablet Take 10 mg by mouth as needed for allergies or rhinitis.     Historical Provider, MD  Melatonin 5 MG TABS Take 5 mg by mouth at bedtime as needed (sleep).     Historical Provider, MD  metoprolol succinate (TOPROL-XL) 25 MG 24 hr tablet Take 1 tablet (25 mg total) by mouth daily. 08/07/14   Cassell Clement, MD  Multiple Vitamin (MULTIVITAMIN) tablet Take 1 tablet by mouth daily.      Historical Provider, MD  omeprazole (PRILOSEC) 20 MG capsule Take 1 capsule (20 mg total) by mouth daily. 08/07/14   Cassell Clement, MD  oxybutynin (DITROPAN-XL) 10 MG 24 hr tablet Take 10 mg by mouth daily.      Historical Provider, MD  potassium chloride (MICRO-K) 10 MEQ CR capsule Take 30 mEq by mouth 2 (two) times daily.    Historical Provider, MD  Probiotic Product (PROBIOTIC DAILY PO) Take 1 capsule by mouth daily.     Historical Provider, MD  sertraline (ZOLOFT) 100 MG tablet Take 1.5 tablets (150 mg total) by mouth daily. 08/07/14   Cassell Clement, MD  simvastatin (ZOCOR) 20 MG tablet TAKE 1 TABLET (20 MG TOTAL) BY MOUTH AT BEDTIME. 08/07/14   Cassell Clement, MD  traZODone (DESYREL) 50 MG tablet Take 1 tablet (50 mg total) by mouth daily. 10/11/13   Cassell Clement, MD     Allergies:     Allergies  Allergen Reactions  . Varenicline Other (See Comments)    hallicunations  . Ace Inhibitors     Doesn't recall reaction  . Chantix [Varenicline Tartrate]     nightmares  . Codeine Nausea And Vomiting  . Spironolactone     Doesn't recall reaction  . Latex Rash     Physical Exam:   Vitals  Blood pressure 124/91, pulse 80, temperature 98 F (36.7 C), resp. rate 15, SpO2 98 %.  1.   General: Caucasian female in no acute distress  2. Psychiatric:  Intact judgement and  insight, awake alert, oriented x 3.  3.  Neurologic: No focal neurological deficits, all cranial nerves intact.Strength 5/5 all 4 extremities, sensation mildly reduced on right , plantars down going.Dysarthric speech DTRs 2+ bilaterally  4. Eyes :  anicteric sclerae, moist conjunctivae with no lid lag. PERRLA.  5. ENMT:  Oropharynx clear with moist mucous membranes and good dentition  6. Neck:  supple, no cervical lymphadenopathy appriciated, No thyromegaly  7. Respiratory : Normal respiratory effort, good air movement bilaterally,clear to  auscultation bilaterally  8. Cardiovascular : RRR, no gallops, rubs or murmurs, no leg edema  9. Gastrointestinal:  Positive bowel sounds, abdomen soft, non-tender to palpation,no hepatosplenomegaly, no rigidity or guarding       10. Skin:  No cyanosis, normal texture and turgor, no rash, lesions or ulcers  11.Musculoskeletal:  Good muscle tone,  joints appear normal , no effusions,  normal range of motion    Data Review:    CBC  Recent Labs Lab 03/31/16 0507  WBC 8.0  HGB 12.1  HCT 36.1  PLT 135*  MCV 91.9  MCH 30.8  MCHC 33.5  RDW 12.5  LYMPHSABS 1.4  MONOABS 0.6  EOSABS 0.0  BASOSABS 0.0   ------------------------------------------------------------------------------------------------------------------  Chemistries   Recent Labs Lab 03/31/16 0507  NA 138  K 2.9*  CL 104  CO2 29  GLUCOSE 87  BUN 14  CREATININE 1.05*  CALCIUM 9.1  AST 24  ALT 14  ALKPHOS 49  BILITOT 0.5   ------------------------------------------------------------------------------------------------------------------  ------------------------------------------------------------------------------------------------------------------ GFR: CrCl cannot be calculated (Unknown ideal weight.). Liver Function Tests:  Recent Labs Lab 03/31/16 0507  AST  24  ALT 14  ALKPHOS 49  BILITOT 0.5  PROT 7.4  ALBUMIN 4.1   No results for input(s): LIPASE, AMYLASE in the last 168 hours. No results for input(s): AMMONIA in the last 168 hours. Coagulation Profile:  Recent Labs Lab 03/31/16 0507  INR 0.96   Cardiac Enzymes:  Recent Labs Lab 03/31/16 0507  TROPONINI 0.07*   BNP (last 3 results) No results for input(s): PROBNP in the last 8760 hours. HbA1C: No results for input(s): HGBA1C in the last 72 hours. CBG:  Recent Labs Lab 03/31/16 0509  GLUCAP 124*    --------------------------------------------------------------------------------------------------------------- Urine analysis:    Component Value Date/Time   COLORURINE YELLOW 03/31/2016 0454   APPEARANCEUR CLEAR 03/31/2016 0454   LABSPEC <1.005 (L) 03/31/2016 0454   PHURINE 5.5 03/31/2016 0454   GLUCOSEU NEGATIVE 03/31/2016 0454   HGBUR NEGATIVE 03/31/2016 0454   BILIRUBINUR NEGATIVE 03/31/2016 0454   KETONESUR NEGATIVE 03/31/2016 0454   PROTEINUR NEGATIVE 03/31/2016 0454   NITRITE NEGATIVE 03/31/2016 0454   LEUKOCYTESUR NEGATIVE 03/31/2016 0454      Imaging Results:    Ct Head Code Stroke W/o Cm  Result Date: 03/31/2016 CLINICAL DATA:  Code stroke. RIGHT-sided weakness, slurred speech beginning 3 days ago at 1500 hours. EXAM: CT HEAD WITHOUT CONTRAST TECHNIQUE: Contiguous axial images were obtained from the base of the skull through the vertex without intravenous contrast. COMPARISON:  None. FINDINGS: BRAIN: Wedge-like hypodensity LEFT frontal lobe extending to the insula involving insula, M2, M 4 and M 5 territories. No intraparenchymal hemorrhage, mass effect or midline shift. Mild to moderate ventriculomegaly on the basis of global parenchymal brain volume loss. Patchy white matter hypodensities consistent with mild chronic small vessel ischemic disease. No abnormal extra-axial fluid collections. VASCULAR: Equivocal LEFT insular dot spine.  No dense LEFT MCA.  SKULL: No skull fracture. No significant scalp soft tissue swelling. SINUSES/ORBITS: The mastoid air-cells and included paranasal sinuses  are well-aerated.The included ocular globes and orbital contents are non-suspicious. OTHER: None. ASPECTS [facility] Stroke Program Early CT Score) - Ganglionic level infarction (caudate, lentiform nuclei, internal capsule, insula, M1-M3 cortex): 5 - Supraganglionic infarction (M4-M6 cortex): 1 Total score (0-10 with 10 being normal): 6 IMPRESSION: 1. Acute nonhemorrhagic LEFT frontal/ insular MCA territory stroke. Mild-to-moderate global parenchymal brain volume loss. Mild chronic small vessel ischemic disease. 2. ASPECTS is 6 Critical Value/emergent results were called by telephone at the time of interpretation on 03/31/2016 at 5:10 am to Dr. Rochele Raring , who verbally acknowledged these results. Electronically Signed   By: Awilda Metro M.D.   On: 03/31/2016 05:15    My personal review of EKG: Rhythm NSR   Assessment & Plan:    Active Problems:   CVA (cerebral vascular accident) (HCC)   Stroke (cerebrum) (HCC)   1. Stroke- we'll admit the patient, obtain MRI/MRA brain, carotid Dopplers, echocardiogram, check hemoglobin A1c, lipid profile, Neurochecks.  Will consult neurology. Start aspirin 325 mg daily. Patient takes estradiol hormone therapy, will hold estradiol. 2. Hypertension- hold amlodipine and metoprolol for permissive hypertension 3. Hyperlipidemia-check lipid profile, continue Zocor 4. Hypokalemia- potassium is 2.9, will replace potassium and check BMP in a.m. 5. Mild elevation of troponin- troponin is mildly elevated 0.07, EKG showed no significant ST changes. Patient denies any chest pain, cycle troponin every 6 hours 3. Likely due to stroke.   DVT Prophylaxis-   Lovenox  AM Labs Ordered, also please review Full Orders  Family Communication: No family at bedside. Admission, patients condition and plan of care including tests being ordered  have been discussed with the patient  who indicate understanding and agree with the plan and Code Status.  Code Status:  Full code  Admission status: Inpatient    Time spent in minutes : 60 minutes   LAMA,GAGAN S M.D on 03/31/2016 at 6:55 AM  Between 7am to 7pm - Pager - [phone]. After 7pm go to [email or web address] - password [facility]  [facility] - Office  [phone]

## 2016-03-31 NOTE — Progress Notes (Signed)
Patient was admitted to the hospital earlier this morning with Dr. Sharl MaLama.  Patient seen and examined. Chest and cardiac exam are unremarkable. Strength is 5 out of 5, equal bilaterally in upper and lower extremities. She feels that the slurring of her speech is improving. She had transient blurry vision which is now better.  She was admitted to the hospital and has been diagnosed with a left M2-3 branch occlusion with acute nonhemorrhagic infarct. Carotid Dopplers were unremarkable. Echocardiogram is currently in process. Lipid panel is pending. Symptoms began almost 48 hours prior to the patient's presentation to the emergency room. She is not a candidate for TPA due to delay in arrival. We'll restart her blood pressure medications including amlodipine and Toprol. Neurology has been consulted. She's been started on aspirin. Therapy consultations are pending.  Kingdavid Leinbach

## 2016-03-31 NOTE — Evaluation (Addendum)
Speech Language Pathology Evaluation Patient Details Name: Patricia Norman MRN: [id number] DOB: May 17, 1951 Today's Date: 03/31/2016 Time: 6962-9528 SLP Time Calculation (min) (ACUTE ONLY): 48 min  Problem List:  Patient Active Problem List   Diagnosis Date Noted  . CVA (cerebral vascular accident) (HCC) 03/31/2016  . Cerebrovascular accident (CVA) (HCC) 03/31/2016  . HTN (hypertension) 03/31/2016  . Left knee pain 08/08/2015  . Hypokalemia 10/03/2013  . Sicca syndrome (HCC) 02/21/2011  . Tobacco abuse counseling 02/21/2011  . Benign hypertensive heart disease without heart failure 02/21/2011  . Dyslipidemia 02/21/2011  . ASTHMATIC BRONCHITIS, ACUTE 09/09/2007  . DYSPNEA 09/09/2007  . CHEST PAIN UNSPECIFIED 09/09/2007   Past Medical History:  Past Medical History:  Diagnosis Date  . Chest pain   . Depression   . History of constipation   . Hyperlipidemia   . Hypertension   . Migraine headache   . Tobacco abuse    Past Surgical History:  Past Surgical History:  Procedure Laterality Date  . CARDIOVASCULAR STRESS TEST  08/10/2009   EF 67%  . CHOLECYSTECTOMY    . COLONOSCOPY     DR. Dorena Cookey  . PARTIAL HYSTERECTOMY    . US ECHOCARDIOGRAPHY  12/15/2003   EF 55-60%   HPI:  65 y.o. female, with history of hypertension, hyperlipidemia, tobacco use quit 1 year ago came to the hospital with chief complaint of difficulty moving right arm since Friday around 3 AM, the weakness of arm improved but patient also had intermittent blurry vision with slurred speech.  In the ED CT head was done which showed acute nonhemorrhagic left MCA/insular territory infarct.   Assessment / Plan / Recommendation Clinical Impression  Pt seen in room for cognitive linguistic evaluation while sitting up in chair. Her family (mother, sister, and brother-in-law) were present initially). Pt endorses changes in fine motor coordination in right, dominant hand ("I drop pills") and difficulty speaking clearly.  Pt presents with mild cognitive linguistic deficits characterized by motor speech impairment (presenting more as motor planning at this time vs dysarthria), mild word finding deficits, decreased attention, and working memory deficits. Pt endorses significant depression/grief over the loss of her husband four years ago. She is in counseling and is also managed with medication. Attention and memory deficits noted today may be exacerbated by anxiety and depression, as pt reports recent difficulty managing at home (paying bills/finances). Recommend skilled SLP therapy in outpatient setting to address current deficits, increase independence and quality of life, and decrease burden of care. Pt in agreement with plan of care, however does express concerns about finances and paying for healthcare copays.    SLP Assessment  All further Speech Lanaguage Pathology  needs can be addressed in the next venue of care    Follow Up Recommendations  Outpatient SLP    Frequency and Duration           SLP Evaluation Cognition  Overall Cognitive Status: Impaired/Different from baseline Arousal/Alertness: Awake/alert Orientation Level: Oriented X4 Attention: Sustained Sustained Attention: Impaired Sustained Attention Impairment: Verbal complex;Functional complex Memory: Impaired Memory Impairment: Retrieval deficit (may be impacted by aphasia) Awareness: Impaired (Pt attempting to get up by herself in room) Awareness Impairment: Emergent impairment Problem Solving: Appears intact Safety/Judgment: Appears intact Comments: Emerging safety/awareness       Comprehension  Auditory Comprehension Overall Auditory Comprehension: Impaired Yes/No Questions: Within Functional Limits Commands: Impaired Two Step Basic Commands: 75-100% accurate Complex Commands: 75-100% accurate (Required repetition of directions) Conversation: Complex Interfering Components: Attention;Anxiety;Visual impairments Visual  Recognition/Discrimination  Discrimination: Exceptions to [facility] (Pt reports visual changes, glasses unavailable) Reading Comprehension Reading Status: Within funtional limits [facility](WFL for simple sentences)    Expression Expression Primary Mode of Expression: Verbal Verbal Expression Overall Verbal Expression: Impaired Initiation: No impairment Automatic Speech: Name;Social Response;Month of year Level of Generative/Spontaneous Verbalization: Conversation Repetition: Impaired Level of Impairment: Phrase level Naming: No impairment Pragmatics: No impairment Interfering Components: Attention Effective Techniques: Phonemic cues Non-Verbal Means of Communication: Not applicable Written Expression Dominant Hand: Right Written Expression: Not tested   Oral / Motor  Oral Motor/Sensory Function Overall Oral Motor/Sensory Function: Within functional limits Motor Speech Overall Motor Speech: Impaired Respiration: Within functional limits Phonation: Normal Resonance: Within functional limits Articulation: Impaired Level of Impairment: Sentence Intelligibility: Intelligibility reduced Conversation: 75-100% accurate Motor Planning: Impaired (To be further assessed) Level of Impairment: Sentence Effective Techniques: Slow rate;Pause   Thank you,  Havery MorosDabney Porter, CCC-SLP [phone]                     PORTER,DABNEY 03/31/2016, 6:56 PM

## 2016-03-31 NOTE — ED Notes (Signed)
Troponin 0.07 reported to EDP

## 2016-03-31 NOTE — ED Provider Notes (Signed)
TIME SEEN: 4:10 AM  CHIEF COMPLAINT: Difficulty moving right arm, slurred speech  HPI: Pt is a 65 y.o. female with history of hypertension, hyperlipidemia, tobacco use who quit one year ago but started smoking when she was 65 years old who presents the emergency department with difficulty moving her right arm since Friday around 3 AM, partially 48 hours ago. Also reports that she has intermittent blurry vision and slurred speech. States she is also felt lightheaded. States she has had blurred vision for the past month but seems to be worsening. No vision loss. No numbness or tingling. No other focal weakness. States she noticed that she was having trouble moving her right arm when she was trying to put an earring. States she talked to her friend on the phone today who is concerned about her slurred speech and urged her to come to the hospital. Never had a history of CVA or intracranial hemorrhage. Denies head injury. Denies headache. Not on any anticoagulation or antiplatelets. Denies fever, chest pain or shortness of breath.  ROS: See HPI Constitutional: no fever  Eyes: no drainage  ENT: no runny nose   Cardiovascular:  no chest pain  Resp: no SOB  GI: no vomiting GU: no dysuria Integumentary: no rash  Allergy: no hives  Musculoskeletal: no leg swelling  Neurological: no slurred speech ROS otherwise negative  PAST MEDICAL HISTORY/PAST SURGICAL HISTORY:  Past Medical History:  Diagnosis Date  . Chest pain   . Depression   . History of constipation   . Hyperlipidemia   . Hypertension   . Migraine headache   . Tobacco abuse     MEDICATIONS:  Prior to Admission medications   Medication Sig Start Date End Date Taking? Authorizing Provider  ALPRAZolam Prudy Feeler) 0.5 MG tablet Take 0.5 mg by mouth 2 (two) times daily as needed for anxiety.    Historical Provider, MD  amLODipine (NORVASC) 5 MG tablet Take 1 tablet (5 mg total) by mouth daily. 08/08/15   Cassell Clement, MD   aspirin-acetaminophen-caffeine (EXCEDRIN MIGRAINE) [phone] MG per tablet Take 1 tablet by mouth every 6 (six) hours as needed for headache.     Historical Provider, MD  Calcium Carbonate (CALCIUM 500 PO) Take 1,000 mg by mouth daily.      Historical Provider, MD  cevimeline (EVOXAC) 30 MG capsule Take 1 capsule (30 mg total) by mouth 3 (three) times daily. 11/14/14   Cassell Clement, MD  cholecalciferol (VITAMIN D) 1000 UNITS tablet Take 1,000 Units by mouth daily.     Historical Provider, MD  cyclobenzaprine (FLEXERIL) 10 MG tablet TAKE 1 TABLET BY MOUTH 3 TIMES A DAY AS NEEDED FOR MUSCLE SPASMS 08/02/15   Cassell Clement, MD  estradiol (ESTRACE) 1 MG tablet Take 1 mg by mouth daily. 05/07/15   Historical Provider, MD  famciclovir (FAMVIR) 500 MG tablet Take 3 tablets by mouth as directed. Take 3 tablets by mouth once daily at onset of fever blisters 07/30/15   Historical Provider, MD  fluticasone (FLONASE) 50 MCG/ACT nasal spray Place 1 spray into both nostrils daily as needed for allergies or rhinitis.    Historical Provider, MD  hydrochlorothiazide (MICROZIDE) 12.5 MG capsule TAKE ONE CAPSULE BY MOUTH DAILY 08/02/15   Cassell Clement, MD  loratadine (CLARITIN) 10 MG tablet Take 10 mg by mouth as needed for allergies or rhinitis.     Historical Provider, MD  Melatonin 5 MG TABS Take 5 mg by mouth at bedtime as needed (sleep).     Historical  Provider, MD  metoprolol succinate (TOPROL-XL) 25 MG 24 hr tablet Take 1 tablet (25 mg total) by mouth daily. 08/07/14   Cassell Clementhomas Brackbill, MD  Multiple Vitamin (MULTIVITAMIN) tablet Take 1 tablet by mouth daily.      Historical Provider, MD  omeprazole (PRILOSEC) 20 MG capsule Take 1 capsule (20 mg total) by mouth daily. 08/07/14   Cassell Clementhomas Brackbill, MD  oxybutynin (DITROPAN-XL) 10 MG 24 hr tablet Take 10 mg by mouth daily.      Historical Provider, MD  potassium chloride (MICRO-K) 10 MEQ CR capsule Take 30 mEq by mouth 2 (two) times daily.    Historical  Provider, MD  Probiotic Product (PROBIOTIC DAILY PO) Take 1 capsule by mouth daily.     Historical Provider, MD  sertraline (ZOLOFT) 100 MG tablet Take 1.5 tablets (150 mg total) by mouth daily. 08/07/14   Cassell Clementhomas Brackbill, MD  simvastatin (ZOCOR) 20 MG tablet TAKE 1 TABLET (20 MG TOTAL) BY MOUTH AT BEDTIME. 08/07/14   Cassell Clementhomas Brackbill, MD  traZODone (DESYREL) 50 MG tablet Take 1 tablet (50 mg total) by mouth daily. 10/11/13   Cassell Clementhomas Brackbill, MD    ALLERGIES:  Allergies  Allergen Reactions  . Varenicline Other (See Comments)    hallicunations  . Ace Inhibitors     Doesn't recall reaction  . Chantix [Varenicline Tartrate]     nightmares  . Codeine Nausea And Vomiting  . Spironolactone     Doesn't recall reaction  . Latex Rash    SOCIAL HISTORY:  Social History  Substance Use Topics  . Smoking status: Former Smoker    Packs/day: 0.50  . Smokeless tobacco: Never Used  . Alcohol use No    FAMILY HISTORY: Family History  Problem Relation Age of Onset  . Hypertension Mother   . Angina Father     EXAM: BP 133/98   Pulse 73   Temp 98 F (36.7 C)   Resp 12   SpO2 99%  CONSTITUTIONAL: Alert and oriented x 3 and responds appropriately to questions. In no acute distress HEAD: Normocephalic EYES: Conjunctivae clear, PERRL ENT: normal nose; no rhinorrhea; moist mucous membranes NECK: Supple, no meningismus, no LAD  CARD: RRR; S1 and S2 appreciated; no murmurs, no clicks, no rubs, no gallops RESP: Normal chest excursion without splinting or tachypnea; breath sounds clear and equal bilaterally; no wheezes, no rhonchi, no rales, no hypoxia or respiratory distress, speaking full sentences ABD/GI: Normal bowel sounds; non-distended; soft, non-tender, no rebound, no guarding, no peritoneal signs BACK:  The back appears normal and is non-tender to palpation, there is no CVA tenderness EXT: Normal ROM in all joints; non-tender to palpation; no edema; normal capillary refill; no  cyanosis, no calf tenderness or swelling   SKIN: Normal color for age and race; warm; no rash NEURO: Moves all extremities equally, no pronator drift, sensation to light touch is intact diffusely, strength 5/5 in all 4 extremity, patient does seem to have difficulty with heel-to-shin testing bilaterally as well as some dysmetria to finger to nose testing bilaterally especially with the right arm, patient appears to have decreased visual acuity when checking visual fields in the right upper visual field but otherwise visual fields are normal, extraocular movements intact, has mild dysarthria and some difficulty finding words, possible mild right-sided facial paralysis as it appears that she has flattened nasolabial on that side, NIHSS is 6 PSYCH: The patient's mood and manner are appropriate. Grooming and personal hygiene are appropriate.  MEDICAL DECISION MAKING: Patient here  with symptoms concerning for stroke. Symptoms started over 48 hours ago. She is outside of TPA window. NIH scale 6.  ED PROGRESS: Discussed with radiologist Dr. Karie Kirks.  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 Will give full dose aspirin. Labs show hypokalemia with potassium of 2.9 with no EKG changes. We'll give IV potassium. Troponin mildly elevated at 0.07. She is not having any ischemic changes on her EKG denies chest pain or shortness of breath. She does not have a primary care physician. We'll discuss with hospitalist for admission for stroke workup.   6:17 AM  Discussed patient's case with hospitalist, Dr. Sharl Ma.  Recommend admission to inpatient, telemetry bed.  I will place holding orders per their request. Patient and family (if present) updated with plan. Care transferred to hospitalist service.  I reviewed all nursing notes, vitals, pertinent old records, EKGs, labs, imaging (as available).   EKG Interpretation  Date/Time:  Monday March 31 2016 04:16:25 EDT Ventricular Rate:  80 PR  Interval:    QRS Duration: 91 QT Interval:  408 QTC Calculation: 471 R Axis:   43 Text Interpretation:  Sinus rhythm No significant change since last tracing Confirmed by WARD,  DO, KRISTEN ([id number]) on 03/31/2016 4:22:55 AM         Patricia Norman Ward, DO 03/31/16 0865

## 2016-03-31 NOTE — Progress Notes (Signed)
PT Cancellation Note  Patient Details Name: Patricia BladeDeborah Sainsbury MRN: [id number] DOB: 06/22/1950   Cancelled Treatment:    Reason Eval/Treat Not Completed: Other (comment) (Pt is just now eating her breakfast tray.  Will check back later. )   Beth Yessenia Maillet, PT, DPT X: [phone]

## 2016-03-31 NOTE — Evaluation (Signed)
Physical Therapy Evaluation Patient Details Name: Patricia Norman MRN: [id number] DOB: 1951/04/02 Today's Date: 03/31/2016   History of Present Illness  65 y.o. female, with history of hypertension, hyperlipidemia, tobacco use quit 1 year ago came to the hospital with chief complaint of difficulty moving right arm since Friday around 3 AM, the weakness of arm improved but patient also had intermittent blurry vision with slurred speech.  In the ED CT head was done which showed acute nonhemorrhagic left MCA/insular territory infarct.  Clinical Impression  Pt received in bed, sister and brother in law from [place] visiting, and pt is agreeable to PT evaluation.  Pt expressed that she lives alone and is normally very independent with all functional mobility and ADL's.  During PT evaluation today she demonstrated 3 episodes of scissor stepping for correction of minor LOB during gait.  She also demonstrates decreased gait speed at 0.64ft/sec when the norm for her age group is 2.20ft/sec.  This increases her risk of falling.  She is recommended to f/u with OPPT for continued strength, balance, and mobility training.  Also recommend that she have someone stay with her for several days to ensure safety at home after discharge.     Follow Up Recommendations Outpatient PT (Pt will likely need OP OT also due to UE incoordination. )    Equipment Recommendations  None recommended by PT    Recommendations for Other Services       Precautions / Restrictions Precautions Precautions: Fall Restrictions Weight Bearing Restrictions: No      Mobility  Bed Mobility Overal bed mobility: Modified Independent                Transfers Overall transfer level: Modified independent Equipment used: None             General transfer comment: 5 x sit <>stand done with B UE suppprt in 15.54seconds.    Ambulation/Gait Ambulation/Gait assistance: Min guard Ambulation Distance (Feet): 200  Feet Assistive device: None Gait Pattern/deviations: Step-through pattern;Scissoring Gait velocity: 0.95 ft/sec which is indicative of risk for recurrent falls.  (Norm for pt's age group is 2.16ft/sec) Gait velocity interpretation: <1.8 ft/sec, indicative of risk for recurrent falls General Gait Details: Pt noted to scissor step x 3 as a reaction to correct her balance.    Stairs            Wheelchair Mobility    Modified Rankin (Stroke Patients Only)       Balance Overall balance assessment: Needs assistance Sitting-balance support: No upper extremity supported;Feet supported Sitting balance-Leahy Scale: Normal     Standing balance support: No upper extremity supported Standing balance-Leahy Scale: Fair                               Pertinent Vitals/Pain Pain Assessment: No/denies pain    Home Living Family/patient expects to be discharged to:: Private residence Living Arrangements: Alone Available Help at Discharge: Family (Pt has a son in law that she can call to come assist her with things as needed. ) Type of Home: House Home Access: Stairs to enter   [employer] of Steps: 3 Home Layout: One level Home Equipment: None Additional Comments: Pt stated that her mother has a BSC, RW, and a cane if she needed to borrow something.     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  Hand Dominance   Dominant Hand: Right    Extremity/Trunk Assessment   Upper Extremity Assessment: RUE deficits/detail;LUE deficits/detail RUE Deficits / Details: Noted incoordination with R finger to thumb task, as well as rapid alternating movement of the UE's.  Pt states she still has diminished sensation.      LUE Deficits / Details: Noted incoordination with L finger to thumb task, as well as rapid alternating movement of the UE's.     Lower Extremity Assessment:  Overall WFL for tasks assessed         Communication   Communication: Expressive difficulties  Cognition Arousal/Alertness: Awake/alert Behavior During Therapy: WFL for tasks assessed/performed Overall Cognitive Status: Within Functional Limits for tasks assessed                      General Comments      Exercises     Assessment/Plan    PT Assessment Patient needs continued PT services  PT Problem List Decreased strength;Decreased activity tolerance;Decreased balance;Decreased mobility;Decreased coordination;Decreased safety awareness;Decreased knowledge of precautions;Impaired sensation;Decreased knowledge of use of DME          PT Treatment Interventions Gait training;Functional mobility training;Therapeutic activities;Therapeutic exercise;Balance training;Neuromuscular re-education;Patient/family education    PT Goals (Current goals can be found in the Care Plan section)  Acute Rehab PT Goals Patient Stated Goal: Pt wants to go home.  PT Goal Formulation: With patient Time For Goal Achievement: 04/07/16 Potential to Achieve Goals: Good    Frequency Min 3X/week   Barriers to discharge Decreased caregiver support Pt lives alone    Co-evaluation               End of Session Equipment Utilized During Treatment: Gait belt Activity Tolerance: Patient tolerated treatment well Patient left: in chair;with call bell/phone within reach;with family/visitor present Nurse Communication: Mobility status Horticulturist, commercial(Becky, RN notified of pt's mobility status. )    Functional Assessment Tool Used: [employer] AM-PAC "6-clicks"  Functional Limitation: Mobility: Walking and moving around Mobility: Walking and Moving Around Current Status [phone](G8978): At least 20 percent but less than 40 percent impaired, limited or restricted Mobility: Walking and Moving Around Goal Status [phone](G8979): At least 1 percent but less than 20 percent impaired, limited or restricted    Time:  1449-1520 PT Time Calculation (min) (ACUTE ONLY): 31 min   Charges:   PT Evaluation $PT Eval Low Complexity: 1 Procedure PT Treatments $Gait Training: 8-22 mins   PT G Codes:   PT G-Codes **NOT FOR INPATIENT CLASS** Functional Assessment Tool Used: [employer] AM-PAC "6-clicks"  Functional Limitation: Mobility: Walking and moving around Mobility: Walking and Moving Around Current Status [phone](G8978): At least 20 percent but less than 40 percent impaired, limited or restricted Mobility: Walking and Moving Around Goal Status [phone](G8979): At least 1 percent but less than 20 percent impaired, limited or restricted    Beth Tanja Gift, PT, DPT X: [phone]

## 2016-03-31 NOTE — Progress Notes (Signed)
*  PRELIMINARY RESULTS* Echocardiogram 2D Echocardiogram has been performed.  Stacey DrainWhite, Darbie Biancardi J 03/31/2016, 3:04 PM

## 2016-03-31 NOTE — Consult Note (Signed)
Patricia A. Merlene Laughter, MD     [email or web address]          Patricia Norman is an 65 y.o. female.   ASSESSMENT/PLAN: 1. 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 The etiology is most likely from intracranial atherosclerosis. Risk factors includes hypertension, dyslipidemia, age and nicotine use. I will recommend the patient be placed on dual antiplatelet agents for 3 months. Subsequently, she can be treated with a single agent preferably aspirin 325. We will continue with the use of the current statin medication although it further adjustments may be needed depending on the lipid profile. Additional labs will also be obtained. Follow-up should be with [place] in the office in one month.  There is a 65 year old right-handed white female who woke up a couple days ago with right upper extremity weakness. She went back to bed and noted that she still had some weakness of the right hand but it improved relative to when she woke up in the middle of the night. She decided to seek medical attention on the urging of her friends who noted that she had some difficulty speaking. Legs not to be involved. She seemed to be able to ambulate fairly well. She reports having some visual symptoms mostly with blurring of her vision and some headache. She has a long-standing history of headaches in fact did see Patricia Norman for about 10 years. She tells me that she try multiple medications and different Treatment with apparently no significant benefit. She went tried Botox injections. The patient does not report concurrent symptoms such as chest pain, shortness of breath, GI GU symptoms. No difficulty swallowing is reported. She again did have some difficulties with finding words. She reports her symptoms are improved but she still has problems with the right hand being weak and having hand dexterity issues. She also has word finding difficulties. The patient is  taking Excedrin migraine. I did consul her about taking too much of this medication as it is known to cause rebound headaches. The review systems is otherwise negative.    GENERAL: This a pleasant female in no acute distress.  HEENT: Normal.  ABDOMEN: soft  EXTREMITIES: No edema   BACK: Normal  SKIN: Normal by inspection.    MENTAL STATUS: Alert and oriented. Orientation includes her age and month.  Speech and cognition are generally intact. Judgment and insight normal. She has some subtle word finding difficulties although she did fairly well directly on name testing only having some difficulties with one-word but eventually get it right. Repetition, comprehension and fluency are relatively preserved.  CRANIAL NERVES: Pupils are equal, round and reactive to light and accomodation; extra ocular movements are full, there is no significant nystagmus; visual fields are full; upper and lower facial muscles are normal in strength and symmetric, there is no flattening of the nasolabial folds; tongue is midline; uvula is midline; shoulder elevation is normal.  MOTOR: Normal tone, bulk and strength except for mild weakness on finger abduction on the right;  other muscle groups of the right upper extremity including triceps, deltoid and hand grip were normal; no pronator drift. No drift of the legs.   COORDINATION: Left finger to nose is normal, right finger to nose is normal, No rest tremor; no intention tremor; no postural tremor; no bradykinesia.  REFLEXES: Deep tendon reflexes are symmetrical and normal. Babinski reflexes are flexor bilaterally.   SENSATION: Normal to light touch, temperature, and pinprick.  GAIT: Normal.  NIH stroke scale 1.        Brain MRI is reviewed and shows increased signal wedge-shaped involving the posterior frontal lobe on the left side. The abnormality is seen on several cuts on diffusion imaging with corresponding reduced signal on the ADC scan. No  microhemorrhages are chronic hand hemorrhages are appreciated. There is moderate deep white matter increased signal seen on FLAIR imaging. No chronic infarcts are appreciated.  Blood pressure 133/81, pulse 80, temperature 98.6 F (37 C), temperature source Oral, resp. rate 16, height '4\' 10"'$  (1.473 m), weight 134 lb (60.8 kg), SpO2 98 %.  Past Medical History:  Diagnosis Date  . Chest pain   . Depression   . History of constipation   . Hyperlipidemia   . Hypertension   . Migraine headache   . Tobacco abuse     Past Surgical History:  Procedure Laterality Date  . CARDIOVASCULAR STRESS TEST  08/10/2009   EF 67%  . CHOLECYSTECTOMY    . COLONOSCOPY     DR. Teena Norman  . PARTIAL HYSTERECTOMY    . US ECHOCARDIOGRAPHY  12/15/2003   EF 55-60%    Family History  Problem Relation Age of Onset  . Hypertension Mother   . Angina Father     Social History:  reports that she has quit smoking. She smoked 0.50 packs per day. She has never used smokeless tobacco. She reports that she does not drink alcohol or use drugs.  Allergies:  Allergies  Allergen Reactions  . Varenicline Other (See Comments)    hallicunations  . Ace Inhibitors     Doesn't recall reaction  . Chantix [Varenicline Tartrate]     nightmares  . Codeine Nausea And Vomiting  . Spironolactone     Doesn't recall reaction  . Latex Rash    Medications: Prior to Admission medications   Medication Sig Start Date End Date Taking? Authorizing Norman  ALPRAZolam Patricia Norman) 0.5 MG tablet Take 0.5 mg by mouth 2 (two) times daily as needed for anxiety.   Yes Patricia Provider, MD  amLODipine (NORVASC) 5 MG tablet Take 1 tablet (5 mg total) by mouth daily. 08/08/15  Yes Patricia Coco, MD  aspirin-acetaminophen-caffeine (EXCEDRIN MIGRAINE) [phone] MG per tablet Take 1 tablet by mouth every 6 (six) hours as needed for headache.    Yes Patricia Provider, MD  Calcium Carbonate (CALCIUM 500 PO) Take 1,000 mg by mouth daily.      Yes Patricia Provider, MD  cevimeline (Patricia Norman) 30 MG capsule Take 1 capsule (30 mg total) by mouth 3 (three) times daily. 11/14/14  Yes Patricia Coco, MD  cholecalciferol (VITAMIN D) 1000 UNITS tablet Take 1,000 Units by mouth daily.    Yes Patricia Provider, MD  cyclobenzaprine (FLEXERIL) 10 MG tablet TAKE 1 TABLET BY MOUTH 3 TIMES A DAY AS NEEDED FOR MUSCLE SPASMS 08/02/15  Yes Patricia Coco, MD  famciclovir (FAMVIR) 500 MG tablet Take 3 tablets by mouth as directed. Take 3 tablets by mouth once daily at onset of fever blisters 07/30/15  Yes Patricia Provider, MD  fluticasone (FLONASE) 50 MCG/ACT nasal spray Place 1 spray into both nostrils daily as needed for allergies or rhinitis.   Yes Patricia Provider, MD  hydrochlorothiazide (MICROZIDE) 12.5 MG capsule TAKE ONE CAPSULE BY MOUTH DAILY 08/02/15  Yes Patricia Coco, MD  loratadine (CLARITIN) 10 MG tablet Take 10 mg by mouth as needed for allergies or rhinitis.    Yes Patricia Provider, MD  Melatonin 5 MG TABS Take 5 mg  by mouth at bedtime as needed (sleep).    Yes Patricia Provider, MD  metoprolol succinate (TOPROL-XL) 25 MG 24 hr tablet Take 1 tablet (25 mg total) by mouth daily. 08/07/14  Yes Patricia Coco, MD  omeprazole (PRILOSEC) 20 MG capsule Take 1 capsule (20 mg total) by mouth daily. 08/07/14  Yes Patricia Coco, MD  oxybutynin (DITROPAN-XL) 10 MG 24 hr tablet Take 10 mg by mouth daily.     Yes Patricia Provider, MD  potassium chloride (MICRO-K) 10 MEQ CR capsule Take 30 mEq by mouth 2 (two) times daily.   Yes Patricia Provider, MD  sertraline (ZOLOFT) 100 MG tablet Take 1.5 tablets (150 mg total) by mouth daily. 08/07/14  Yes Patricia Coco, MD  simvastatin (ZOCOR) 20 MG tablet TAKE 1 TABLET (20 MG TOTAL) BY MOUTH AT BEDTIME. 08/07/14  Yes Patricia Coco, MD  traZODone (DESYREL) 50 MG tablet Take 1 tablet (50 mg total) by mouth daily. 10/11/13  Yes Patricia Coco, MD    Scheduled Meds: . amLODipine  5 mg Oral  Daily  . aspirin  300 mg Rectal Daily   Or  . aspirin  325 mg Oral Daily  . cevimeline  30 mg Oral TID  . enoxaparin (LOVENOX) injection  40 mg Subcutaneous Q24H  . [START ON 04/01/2016] Influenza vac split quadrivalent PF  0.5 mL Intramuscular Tomorrow-1000  . metoprolol succinate  25 mg Oral Daily  . oxybutynin  10 mg Oral Daily  . pantoprazole  40 mg Oral Daily  . [START ON 04/01/2016] pneumococcal 23 valent vaccine  0.5 mL Intramuscular Tomorrow-1000  . sertraline  150 mg Oral Daily  . simvastatin  20 mg Oral q1800  . traZODone  50 mg Oral QHS   Continuous Infusions:  PRN Meds:.ALPRAZolam, cyclobenzaprine, fluticasone, loratadine, senna-docusate     Results for orders placed or performed during the hospital encounter of 03/31/16 (from the past 48 hour(s))  Urine rapid drug screen (hosp performed)not at [facility]     Status: Abnormal   Collection Time: 03/31/16  4:54 AM  Result Value Ref Range   Opiates NONE DETECTED NONE DETECTED   Cocaine NONE DETECTED NONE DETECTED   Benzodiazepines POSITIVE (A) NONE DETECTED   Amphetamines NONE DETECTED NONE DETECTED   Tetrahydrocannabinol NONE DETECTED NONE DETECTED   Barbiturates NONE DETECTED NONE DETECTED    Comment:        DRUG SCREEN FOR MEDICAL PURPOSES ONLY.  IF CONFIRMATION IS NEEDED FOR ANY PURPOSE, NOTIFY LAB WITHIN 5 DAYS.        LOWEST DETECTABLE LIMITS FOR URINE DRUG SCREEN Drug Class       Cutoff (ng/mL) Amphetamine      1000 Barbiturate      200 Benzodiazepine   453 Tricyclics       646 Opiates          300 Cocaine          300 THC              50   Urinalysis, Routine w reflex microscopic (not at [facility])     Status: Abnormal   Collection Time: 03/31/16  4:54 AM  Result Value Ref Range   Color, Urine YELLOW YELLOW   APPearance CLEAR CLEAR   Specific Gravity, Urine <1.005 (L) 1.005 - 1.030   pH 5.5 5.0 - 8.0   Glucose, UA NEGATIVE NEGATIVE mg/dL   Hgb urine dipstick NEGATIVE NEGATIVE   Bilirubin Urine NEGATIVE  NEGATIVE   Ketones, ur NEGATIVE NEGATIVE mg/dL  Protein, ur NEGATIVE NEGATIVE mg/dL   Nitrite NEGATIVE NEGATIVE   Leukocytes, UA NEGATIVE NEGATIVE    Comment: MICROSCOPIC NOT DONE ON URINES WITH NEGATIVE PROTEIN, BLOOD, LEUKOCYTES, NITRITE, OR GLUCOSE <1000 mg/dL.  Ethanol     Status: Abnormal   Collection Time: 03/31/16  5:07 AM  Result Value Ref Range   Alcohol, Ethyl (B) 7 (H) <5 mg/dL    Comment:        LOWEST DETECTABLE LIMIT FOR SERUM ALCOHOL IS 5 mg/dL FOR MEDICAL PURPOSES ONLY   Protime-INR     Status: None   Collection Time: 03/31/16  5:07 AM  Result Value Ref Range   Prothrombin Time 12.8 11.4 - 15.2 seconds   INR 0.96   APTT     Status: Abnormal   Collection Time: 03/31/16  5:07 AM  Result Value Ref Range   aPTT 41 (H) 24 - 36 seconds    Comment:        IF BASELINE aPTT IS ELEVATED, SUGGEST PATIENT RISK ASSESSMENT BE USED TO DETERMINE APPROPRIATE ANTICOAGULANT THERAPY.   CBC     Status: Abnormal   Collection Time: 03/31/16  5:07 AM  Result Value Ref Range   WBC 8.0 4.0 - 10.5 K/uL   RBC 3.93 3.87 - 5.11 MIL/uL   Hemoglobin 12.1 12.0 - 15.0 g/dL   HCT 36.1 36.0 - 46.0 %   MCV 91.9 78.0 - 100.0 fL   MCH 30.8 26.0 - 34.0 pg   MCHC 33.5 30.0 - 36.0 g/dL   RDW 12.5 11.5 - 15.5 %   Platelets 135 (L) 150 - 400 K/uL  Differential     Status: None   Collection Time: 03/31/16  5:07 AM  Result Value Ref Range   Neutrophils Relative % 73 %   Neutro Abs 5.8 1.7 - 7.7 K/uL   Lymphocytes Relative 18 %   Lymphs Abs 1.4 0.7 - 4.0 K/uL   Monocytes Relative 8 %   Monocytes Absolute 0.6 0.1 - 1.0 K/uL   Eosinophils Relative 0 %   Eosinophils Absolute 0.0 0.0 - 0.7 K/uL   Basophils Relative 1 %   Basophils Absolute 0.0 0.0 - 0.1 K/uL  Comprehensive metabolic panel     Status: Abnormal   Collection Time: 03/31/16  5:07 AM  Result Value Ref Range   Sodium 138 135 - 145 mmol/L   Potassium 2.9 (L) 3.5 - 5.1 mmol/L   Chloride 104 101 - 111 mmol/L   CO2 29 22 - 32 mmol/L    Glucose, Bld 87 65 - 99 mg/dL   BUN 14 6 - 20 mg/dL   Creatinine, Ser 1.05 (H) 0.44 - 1.00 mg/dL   Calcium 9.1 8.9 - 10.3 mg/dL   Total Protein 7.4 6.5 - 8.1 g/dL   Albumin 4.1 3.5 - 5.0 g/dL   AST 24 15 - 41 U/L   ALT 14 14 - 54 U/L   Alkaline Phosphatase 49 38 - 126 U/L   Total Bilirubin 0.5 0.3 - 1.2 mg/dL   GFR calc non Af Amer 55 (L) >60 mL/min   GFR calc Af Amer >60 >60 mL/min    Comment: (NOTE) The eGFR has been calculated using the CKD EPI equation. This calculation has not been validated in all clinical situations. eGFR's persistently <60 mL/min signify possible Chronic Kidney Disease.    Anion gap 5 5 - 15  Troponin I     Status: Abnormal   Collection Time: 03/31/16  5:07 AM  Result Value  Ref Range   Troponin I 0.07 (HH) <0.03 ng/mL    Comment: CRITICAL RESULT CALLED TO, READ BACK BY AND VERIFIED WITH: HYATT,C AT 5:55AM ON 03/31/16 BY FESTERMAN,C   CBG monitoring, ED     Status: Abnormal   Collection Time: 03/31/16  5:09 AM  Result Value Ref Range   Glucose-Capillary 124 (H) 65 - 99 mg/dL   Comment 1 Notify RN   CBC     Status: Abnormal   Collection Time: 03/31/16 10:22 AM  Result Value Ref Range   WBC 6.5 4.0 - 10.5 K/uL   RBC 3.92 3.87 - 5.11 MIL/uL   Hemoglobin 12.1 12.0 - 15.0 g/dL   HCT 36.2 36.0 - 46.0 %   MCV 92.3 78.0 - 100.0 fL   MCH 30.9 26.0 - 34.0 pg   MCHC 33.4 30.0 - 36.0 g/dL   RDW 12.7 11.5 - 15.5 %   Platelets 144 (L) 150 - 400 K/uL  Creatinine, serum     Status: None   Collection Time: 03/31/16 10:22 AM  Result Value Ref Range   Creatinine, Ser 0.91 0.44 - 1.00 mg/dL   GFR calc non Af Amer >60 >60 mL/min   GFR calc Af Amer >60 >60 mL/min    Comment: (NOTE) The eGFR has been calculated using the CKD EPI equation. This calculation has not been validated in all clinical situations. eGFR's persistently <60 mL/min signify possible Chronic Kidney Disease.   Troponin I (q 6hr x 3)     Status: Abnormal   Collection Time: 03/31/16 10:22 AM   Result Value Ref Range   Troponin I 0.07 (HH) <0.03 ng/mL    Comment: CRITICAL VALUE NOTED.  VALUE IS CONSISTENT WITH PREVIOUSLY REPORTED AND CALLED VALUE.  Magnesium     Status: None   Collection Time: 03/31/16  1:00 PM  Result Value Ref Range   Magnesium 2.2 1.7 - 2.4 mg/dL  Troponin I (q 6hr x 3)     Status: Abnormal   Collection Time: 03/31/16  2:34 PM  Result Value Ref Range   Troponin I 0.05 (HH) <0.03 ng/mL    Comment: CRITICAL VALUE NOTED.  VALUE IS CONSISTENT WITH PREVIOUSLY REPORTED AND CALLED VALUE.    Studies/Results:   BRAIN MRI/MRA CLINICAL DATA:  Right-sided weakness for 3 days with confusion.  EXAM: MRI HEAD WITHOUT CONTRAST  MRA HEAD WITHOUT CONTRAST  TECHNIQUE: Multiplanar, multiecho pulse sequences of the brain and surrounding structures were obtained without intravenous contrast. Angiographic images of the head were obtained using MRA technique without contrast.  COMPARISON:  Head CT from earlier today  FINDINGS: MRI HEAD FINDINGS  Brain: Moderate area of cortically based restricted diffusion in the posterior left frontal cortex. The infarct involves the lower perirolandic cortex. No superimposed hemorrhage. Hydrocephalus, mass, or shift.  Chronic patchy FLAIR hyperintensity in the cerebral white matter, greatest around the lateral ventricles. Tiny remote infarct in the superior left cerebellum.  Vascular: Arterial findings below. Unremarkable dural venous sinus appearance.  Skull and upper cervical spine: Normal marrow signal.  Sinuses/Orbits: Negative.  MRA HEAD FINDINGS  Symmetric carotid arteries. Dominant left vertebral artery with right vertebral artery ending in pica. Left M 2-3 branch occlusion correlating with acute infarct. No more proximal flow limiting left MCA stenosis. There is mild atheromatous type irregularity and narrowing of distal right M1 and bilateral proximal PCA segments. This appearance is likely  accentuated by downward direction of flow these vessels.  Superiorly directed left ophthalmic segment aneurysm measuring 3 mm.  IMPRESSION: 1. 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 2. Chronic microvascular disease. 3. 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      TTE - Left ventricle: The cavity size was normal. Systolic function was   normal. The estimated ejection fraction was in the range of 55%   to 60%. Wall motion was normal; there were no regional wall   motion abnormalities. Left ventricular diastolic function   parameters were normal. - Atrial septum: No defect or patent foramen ovale was identified.   CAROTID DOPPLERS normal        Noely Kuhnle A. Merlene Norman, M.D.  Diplomate, Tax adviser of Psychiatry and Neurology ( Neurology). 03/31/2016, 5:55 PM

## 2016-03-31 NOTE — Progress Notes (Signed)
PT Cancellation Note  Patient Details Name: Patricia Norman MRN: [id number] DOB: 02/07/1951   Cancelled Treatment:    Reason Eval/Treat Not Completed: Other (comment) (Pt is experiencing a quick onset of extreme heart burn at this time, and expressed that she needs something for that before she can work with PT.  RN notified.  BP: 163/96, and HR; 69bpm  Will check back as schedule allows. )   Beth Zacchary Pompei, PT, DPT X: [phone]

## 2016-03-31 NOTE — ED Triage Notes (Signed)
Friday night around 0300 pt went to restroom and lost use of Rt arm.  States since then she has had slurred speech, dizziness and blurred vision.  Pt does have some intermittent slurring at this time, neg for any physical deficits at this time.

## 2016-04-01 DIAGNOSIS — I63412 Cerebral infarction due to embolism of left middle cerebral artery: Secondary | ICD-10-CM

## 2016-04-01 DIAGNOSIS — E876 Hypokalemia: Secondary | ICD-10-CM

## 2016-04-01 LAB — BASIC METABOLIC PANEL
Anion gap: 5 (ref 5–15)
BUN: 18 mg/dL (ref 6–20)
CHLORIDE: 108 mmol/L (ref 101–111)
CO2: 25 mmol/L (ref 22–32)
CREATININE: 0.99 mg/dL (ref 0.44–1.00)
Calcium: 8.5 mg/dL — ABNORMAL LOW (ref 8.9–10.3)
GFR calc Af Amer: >60 mL/min (ref >60)
GFR, EST NON AFRICAN AMERICAN: 59 mL/min — AB (ref >60)
GLUCOSE: 89 mg/dL (ref 65–99)
Potassium: 3.5 mmol/L (ref 3.5–5.1)
SODIUM: 138 mmol/L (ref 135–145)

## 2016-04-01 LAB — LIPID PANEL
CHOL/HDL RATIO: 5 ratio
CHOLESTEROL: 209 mg/dL — AB (ref 0–200)
HDL: 42 mg/dL (ref >40)
LDL Cholesterol: 139 mg/dL — ABNORMAL HIGH (ref 0–99)
TRIGLYCERIDES: 139 mg/dL (ref <150)
VLDL: 28 mg/dL (ref 0–40)

## 2016-04-01 LAB — CBC
HCT: 33.1 % — ABNORMAL LOW (ref 36.0–46.0)
Hemoglobin: 11 g/dL — ABNORMAL LOW (ref 12.0–15.0)
MCH: 30.7 pg (ref 26.0–34.0)
MCHC: 33.2 g/dL (ref 30.0–36.0)
MCV: 92.5 fL (ref 78.0–100.0)
PLATELETS: 146 10*3/uL — AB (ref 150–400)
RBC: 3.58 MIL/uL — ABNORMAL LOW (ref 3.87–5.11)
RDW: 12.7 % (ref 11.5–15.5)
WBC: 6 10*3/uL (ref 4.0–10.5)

## 2016-04-01 LAB — HEMOGLOBIN A1C
Hgb A1c MFr Bld: 5.2 % (ref 4.8–5.6)
Mean Plasma Glucose: 103 mg/dL

## 2016-04-01 MED ORDER — SIMVASTATIN 40 MG PO TABS: 40.0000 mg | ORAL_TABLET | Freq: Every day | ORAL | 1 refills | Status: AC

## 2016-04-01 MED ORDER — ASPIRIN 325 MG PO TABS: 325.0000 mg | ORAL_TABLET | Freq: Every day | ORAL | 2 refills | Status: AC

## 2016-04-01 MED ORDER — CLOPIDOGREL BISULFATE 75 MG PO TABS: 75.0000 mg | ORAL_TABLET | Freq: Every day | ORAL | 2 refills | Status: DC

## 2016-04-01 MED ORDER — ACETAMINOPHEN 325 MG PO TABS
650.0000 mg | ORAL_TABLET | Freq: Four times a day (QID) | ORAL | Status: DC | PRN
Start: 2016-04-01 — End: 2016-04-01
  Administered 2016-04-01: 650 mg via ORAL
  Filled 2016-04-01: qty 2

## 2016-04-01 NOTE — Discharge Summary (Signed)
Physician Discharge Summary  Willey BladeDeborah Bouknight WUJ:[id number] DOB: 08/13/1950 DOA: 03/31/2016  PCP: No PCP Per Patient  Admit date: 03/31/2016 Discharge date: 04/01/2016  Admitted From: Home  Disposition:  Home   Recommendations for Outpatient Follow-up:  1. Follow up with PCP in 1-2 weeks 2. Follow-up with outpatient PT/OT/speech  3. Follow-up with neurology as an outpatient in 4 weeks.   Home Health: No  Equipment/Devices: None   Discharge Condition: Stable  CODE STATUS: FULL  Diet recommendation: Heart Healthy   Brief/Interim Summary: 65 year-old female with a history of HTN, HLD, depression, and tobacco abuse presented with complaints of difficulty of right arm movement and intermittent blurred vision. While in the ED, she was noted to be hypokalemic with an elevated troponin of 0.07 and creatinine of 1.05. Urine screen was positive for benzodiazepines and alcohol level was elevated. MRI showed left M2-3 branch occlusion with acute, nonhemorrhagic infarct. Neurology was consulted and she was started on aspirin/plavix. She was admitted for a stroke.   Discharge Diagnoses:  Active Problems:   Dyslipidemia   Hypokalemia   CVA (cerebral vascular accident) (HCC)   Cerebrovascular accident (CVA) (HCC)   HTN (hypertension)  1. CVA. Patient presented with difficulty of right arm movement and blurred vision. MRA revealed a left M2-3 branch occlusion with acute, nonhemorrhagic infarct. Her syymptoms began 48 hours prior to the emergency room therefore she was not a candidate for TPA. Carotid dropplers were unremarkable. Echocardiogram showed an EF of 55-60%. Lipid panel showed LDL of 139. Neurology was consulted and recommended dual antiplatelet therapy for 3 months, then switching to daily aspirin. She will need to follow-up with neurology in 4 weeks as an outpatient.  2. Elevated troponin. On admission she had an elevated troponin of 0.07. This was likely due to her stroke. Her EKG showed  sinus rhythm with no significant ST changes. She denied any chest pain. Troponin was cycled every 6 hours and have been stable. Echo unremarkable.  3. Hypokalemia. Replaced.  4. HTN. Continue amlodipine and metoprolol as an outpatient.  5. HLD. LDL was elevated at 139. Zocor increased from 20mg  daily to 40mg  daily   Discharge Instructions  Discharge Instructions    Diet - low sodium heart healthy    Complete by:  As directed    Increase activity slowly    Complete by:  As directed        Medication List    TAKE these medications   ALPRAZolam 0.5 MG tablet Commonly known as:  XANAX Take 0.5 mg by mouth 2 (two) times daily as needed for anxiety.   amLODipine 5 MG tablet Commonly known as:  NORVASC Take 1 tablet (5 mg total) by mouth daily.   aspirin 325 MG tablet Take 1 tablet (325 mg total) by mouth daily. Start taking on:  04/02/2016   aspirin-acetaminophen-caffeine 250-250-65 MG tablet Commonly known as:  EXCEDRIN MIGRAINE Take 1 tablet by mouth every 6 (six) hours as needed for headache.   CALCIUM 500 PO Take 1,000 mg by mouth daily.   cevimeline 30 MG capsule Commonly known as:  EVOXAC Take 1 capsule (30 mg total) by mouth 3 (three) times daily.   cholecalciferol 1000 units tablet Commonly known as:  VITAMIN D Take 1,000 Units by mouth daily.   clopidogrel 75 MG tablet Commonly known as:  PLAVIX Take 1 tablet (75 mg total) by mouth daily with breakfast. Start taking on:  04/02/2016   cyclobenzaprine 10 MG tablet Commonly known as:  FLEXERIL TAKE  1 TABLET BY MOUTH 3 TIMES A DAY AS NEEDED FOR MUSCLE SPASMS   famciclovir 500 MG tablet Commonly known as:  FAMVIR Take 3 tablets by mouth as directed. Take 3 tablets by mouth once daily at onset of fever blisters   fluticasone 50 MCG/ACT nasal spray Commonly known as:  FLONASE Place 1 spray into both nostrils daily as needed for allergies or rhinitis.   hydrochlorothiazide 12.5 MG capsule Commonly known as:   MICROZIDE TAKE ONE CAPSULE BY MOUTH DAILY   loratadine 10 MG tablet Commonly known as:  CLARITIN Take 10 mg by mouth as needed for allergies or rhinitis.   Melatonin 5 MG Tabs Take 5 mg by mouth at bedtime as needed (sleep).   metoprolol succinate 25 MG 24 hr tablet Commonly known as:  TOPROL-XL Take 1 tablet (25 mg total) by mouth daily.   omeprazole 20 MG capsule Commonly known as:  PRILOSEC Take 1 capsule (20 mg total) by mouth daily.   oxybutynin 10 MG 24 hr tablet Commonly known as:  DITROPAN-XL Take 10 mg by mouth daily.   potassium chloride 10 MEQ CR capsule Commonly known as:  MICRO-K Take 30 mEq by mouth 2 (two) times daily.   sertraline 100 MG tablet Commonly known as:  ZOLOFT Take 1.5 tablets (150 mg total) by mouth daily.   simvastatin 40 MG tablet Commonly known as:  ZOCOR Take 1 tablet (40 mg total) by mouth daily at 6 PM. What changed:  medication strength  how much to take  how to take this  when to take this  additional instructions   traZODone 50 MG tablet Commonly known as:  DESYREL Take 1 tablet (50 mg total) by mouth daily.       Allergies  Allergen Reactions  . Varenicline Other (See Comments)    hallicunations  . Ace Inhibitors     Doesn't recall reaction  . Chantix [Varenicline Tartrate]     nightmares  . Codeine Nausea And Vomiting  . Spironolactone     Doesn't recall reaction  . Latex Rash    Consultations:  Neurology    Procedures/Studies: Dg Chest 2 View  Result Date: 03/31/2016 CLINICAL DATA:  Stroke, hypertension. EXAM: CHEST  2 VIEW COMPARISON:  None. FINDINGS: Heart and mediastinal contours are within normal limits. No focal opacities or effusions. No acute bony abnormality. IMPRESSION: No active cardiopulmonary disease. Electronically Signed   By: Charlett Nose M.D.   On: 03/31/2016 09:09   Mr Patricia Norman Wo Contrast  Result Date: 03/31/2016 CLINICAL DATA:  Right-sided weakness for 3 days with confusion.  EXAM: MRI Norman WITHOUT CONTRAST MRA Norman WITHOUT CONTRAST TECHNIQUE: Multiplanar, multiecho pulse sequences of the brain and surrounding structures were obtained without intravenous contrast. Angiographic images of the Norman were obtained using MRA technique without contrast. COMPARISON:  Norman CT from earlier today FINDINGS: MRI Norman FINDINGS Brain: Moderate area of cortically based restricted diffusion in the posterior left frontal cortex. The infarct involves the lower perirolandic cortex. No superimposed hemorrhage. Hydrocephalus, mass, or shift. Chronic patchy FLAIR hyperintensity in the cerebral white matter, greatest around the lateral ventricles. Tiny remote infarct in the superior left cerebellum. Vascular: Arterial findings below. Unremarkable dural venous sinus appearance. Skull and upper cervical spine: Normal marrow signal. Sinuses/Orbits: Negative. MRA Norman FINDINGS Symmetric carotid arteries. Dominant left vertebral artery with right vertebral artery ending in pica. Left M 2-3 branch occlusion correlating with acute infarct. No more proximal flow limiting left MCA stenosis. There is mild atheromatous  type irregularity and narrowing of distal right M1 and bilateral proximal PCA segments. This appearance is likely accentuated by downward direction of flow these vessels. Superiorly directed left ophthalmic segment aneurysm measuring 3 mm. IMPRESSION: 1. 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 2. Chronic microvascular disease. 3. 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 Electronically Signed   By: Marnee Spring M.D.   On: 03/31/2016 10:02   Mr Brain Wo Contrast  Result Date: 03/31/2016 CLINICAL DATA:  Right-sided weakness for 3 days with confusion. EXAM: MRI Norman WITHOUT CONTRAST MRA Norman WITHOUT CONTRAST TECHNIQUE: Multiplanar, multiecho pulse sequences of the brain and surrounding structures were obtained without intravenous contrast. Angiographic images of the Norman were  obtained using MRA technique without contrast. COMPARISON:  Norman CT from earlier today FINDINGS: MRI Norman FINDINGS Brain: Moderate area of cortically based restricted diffusion in the posterior left frontal cortex. The infarct involves the lower perirolandic cortex. No superimposed hemorrhage. Hydrocephalus, mass, or shift. Chronic patchy FLAIR hyperintensity in the cerebral white matter, greatest around the lateral ventricles. Tiny remote infarct in the superior left cerebellum. Vascular: Arterial findings below. Unremarkable dural venous sinus appearance. Skull and upper cervical spine: Normal marrow signal. Sinuses/Orbits: Negative. MRA Norman FINDINGS Symmetric carotid arteries. Dominant left vertebral artery with right vertebral artery ending in pica. Left M 2-3 branch occlusion correlating with acute infarct. No more proximal flow limiting left MCA stenosis. There is mild atheromatous type irregularity and narrowing of distal right M1 and bilateral proximal PCA segments. This appearance is likely accentuated by downward direction of flow these vessels. Superiorly directed left ophthalmic segment aneurysm measuring 3 mm. IMPRESSION: 1. 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 2. Chronic microvascular disease. 3. 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 Electronically Signed   By: Marnee Spring M.D.   On: 03/31/2016 10:02   US Carotid Bilateral (at [facility] And [facility] Only)  Result Date: 03/31/2016 CLINICAL DATA:  Weakness.  Blurred vision. EXAM: BILATERAL CAROTID DUPLEX ULTRASOUND TECHNIQUE: Wallace Cullens scale imaging, color Doppler and duplex ultrasound were performed of bilateral carotid and vertebral arteries in the neck. COMPARISON:  MRI 03/31/2016. FINDINGS: Criteria: Quantification of carotid stenosis is based on velocity parameters that correlate the residual internal carotid diameter with NASCET-based stenosis levels, using the diameter of the distal internal carotid lumen as the  denominator for stenosis measurement. The following velocity measurements were obtained: RIGHT ICA:  49/21 cm/sec CCA:  64/21 cm/sec SYSTOLIC ICA/CCA RATIO:  0.8 DIASTOLIC ICA/CCA RATIO:  1.0 ECA:  56 cm/sec LEFT ICA:  60/21 cm/sec CCA:  54/19 cm/sec SYSTOLIC ICA/CCA RATIO:  1.1 DIASTOLIC ICA/CCA RATIO:  1.1 ECA:  48 cm/sec RIGHT CAROTID ARTERY: No significant carotid atherosclerotic vascular disease. RIGHT VERTEBRAL ARTERY:  Patent with antegrade flow. LEFT CAROTID ARTERY: No significant carotid atherosclerotic vascular disease. LEFT VERTEBRAL ARTERY:  Patent with antegrade flow. IMPRESSION: 1. No significant carotid atherosclerotic vascular disease. 2. Vertebral arteries are patent antegrade flow . Electronically Signed   By: Maisie Fus  Register   On: 03/31/2016 10:42   Ct Norman Code Stroke W/o Cm  Result Date: 03/31/2016 CLINICAL DATA:  Code stroke. RIGHT-sided weakness, slurred speech beginning 3 days ago at 1500 hours. EXAM: CT Norman WITHOUT CONTRAST TECHNIQUE: Contiguous axial images were obtained from the base of the skull through the vertex without intravenous contrast. COMPARISON:  None. FINDINGS: BRAIN: Wedge-like hypodensity LEFT frontal lobe extending to the insula involving insula, M2, M 4 and M 5 territories. No intraparenchymal hemorrhage, mass effect or midline  shift. Mild to moderate ventriculomegaly on the basis of global parenchymal brain volume loss. Patchy white matter hypodensities consistent with mild chronic small vessel ischemic disease. No abnormal extra-axial fluid collections. VASCULAR: Equivocal LEFT insular dot spine.  No dense LEFT MCA. SKULL: No skull fracture. No significant scalp soft tissue swelling. SINUSES/ORBITS: The mastoid air-cells and included paranasal sinuses are well-aerated.The included ocular globes and orbital contents are non-suspicious. OTHER: None. ASPECTS [facility] Stroke Program Early CT Score) - Ganglionic level infarction (caudate, lentiform nuclei, internal  capsule, insula, M1-M3 cortex): 5 - Supraganglionic infarction (M4-M6 cortex): 1 Total score (0-10 with 10 being normal): 6 IMPRESSION: 1. Acute nonhemorrhagic LEFT frontal/ insular MCA territory stroke. Mild-to-moderate global parenchymal brain volume loss. Mild chronic small vessel ischemic disease. 2. ASPECTS is 6 Critical Value/emergent results were called by telephone at the time of interpretation on 03/31/2016 at 5:10 am to Dr. Rochele Raring , who verbally acknowledged these results. Electronically Signed   By: Awilda Metro M.D.   On: 03/31/2016 05:15   Echo  - Left ventricle: The cavity size was normal. Systolic function was   normal. The estimated ejection fraction was in the range of 55%   to 60%. Wall motion was normal; there were no regional wall   motion abnormalities. Left ventricular diastolic function   parameters were normal. - Atrial septum: No defect or patent foramen ovale was identified.   Subjective: Patient is feeling better. Right had weakness is improving  Discharge Exam: Vitals:   04/01/16 0924 04/01/16 1017  BP: 122/82 117/73  Pulse:  72  Resp:  18  Temp:  97.7 F (36.5 C)   Vitals:   04/01/16 0600 04/01/16 0802 04/01/16 0924 04/01/16 1017  BP:  124/79 122/82 117/73  Pulse:  78  72  Resp:  18  18  Temp: 98.6 F (37 C) 98.7 F (37.1 C)  97.7 F (36.5 C)  TempSrc: Oral Oral  Oral  SpO2:  96%  100%  Weight:      Height:        General: Pt is alert, awake, not in acute distress Cardiovascular: RRR, S1/S2 +, no rubs, no gallops Respiratory: CTA bilaterally, no wheezing, no rhonchi Abdominal: Soft, NT, ND, bowel sounds + Extremities: no edema, no cyanosis    The results of significant diagnostics from this hospitalization (including imaging, microbiology, ancillary and laboratory) are listed below for reference.     Microbiology: No results found for this or any previous visit (from the past 240 hour(s)).   Labs: BNP (last 3 results) No  results for input(s): BNP in the last 8760 hours. Basic Metabolic Panel:  Recent Labs Lab 03/31/16 0507 03/31/16 1022 03/31/16 1300 04/01/16 0618  NA 138  --   --  138  K 2.9*  --   --  3.5  CL 104  --   --  108  CO2 29  --   --  25  GLUCOSE 87  --   --  89  BUN 14  --   --  18  CREATININE 1.05* 0.91  --  0.99  CALCIUM 9.1  --   --  8.5*  MG  --   --  2.2  --    Liver Function Tests:  Recent Labs Lab 03/31/16 0507  AST 24  ALT 14  ALKPHOS 49  BILITOT 0.5  PROT 7.4  ALBUMIN 4.1   No results for input(s): LIPASE, AMYLASE in the last 168 hours. No results for input(s): AMMONIA  in the last 168 hours. CBC:  Recent Labs Lab 03/31/16 0507 03/31/16 1022 04/01/16 0618  WBC 8.0 6.5 6.0  NEUTROABS 5.8  --   --   HGB 12.1 12.1 11.0*  HCT 36.1 36.2 33.1*  MCV 91.9 92.3 92.5  PLT 135* 144* 146*   Cardiac Enzymes:  Recent Labs Lab 03/31/16 0507 03/31/16 1022 03/31/16 1434 03/31/16 2002  TROPONINI 0.07* 0.07* 0.05* 0.03*   BNP: Invalid input(s): POCBNP CBG:  Recent Labs Lab 03/31/16 0509  GLUCAP 124*   D-Dimer No results for input(s): DDIMER in the last 72 hours. Hgb A1c  Recent Labs  03/31/16 1022  HGBA1C 5.2   Lipid Profile  Recent Labs  04/01/16 0618  CHOL 209*  HDL 42  LDLCALC 139*  TRIG 139  CHOLHDL 5.0   Thyroid function studies  Recent Labs  03/31/16 2002  TSH 2.889   Anemia work up  Recent Labs  03/31/16 1434  VITAMINB12 446   Urinalysis    Component Value Date/Time   COLORURINE YELLOW 03/31/2016 0454   APPEARANCEUR CLEAR 03/31/2016 0454   LABSPEC <1.005 (L) 03/31/2016 0454   PHURINE 5.5 03/31/2016 0454   GLUCOSEU NEGATIVE 03/31/2016 0454   HGBUR NEGATIVE 03/31/2016 0454   BILIRUBINUR NEGATIVE 03/31/2016 0454   KETONESUR NEGATIVE 03/31/2016 0454   PROTEINUR NEGATIVE 03/31/2016 0454   NITRITE NEGATIVE 03/31/2016 0454   LEUKOCYTESUR NEGATIVE 03/31/2016 0454   Sepsis Labs Invalid input(s): PROCALCITONIN,  WBC,   LACTICIDVEN Microbiology No results found for this or any previous visit (from the past 240 hour(s)).   Time coordinating discharge: Over 30 minutes  SIGNED:   Erick Blinks, MD  [facility] 04/01/2016, 10:18 AM If 7PM-7AM, please contact night-coverage [email or web address] Password [email or web address]

## 2016-04-01 NOTE — Care Management Note (Signed)
Case Management Note  Patient Details  Name: Patricia BladeDeborah Switala MRN: [id number] Date of Birth: 09/06/1950    Expected Discharge Date:       04/01/2016           Expected Discharge Plan:  Home/Self Care  In-House Referral:  NA  Discharge planning Services  CM Consult  Post Acute Care Choice:  NA Choice offered to:  NA  DME Arranged:    DME Agency:     HH Arranged:    HH Agency:     Status of Service:  Completed, signed off  If discussed at [employer] Length of Stay Meetings, dates discussed:    Additional Comments: Patient discharging today, referral faxed for  Outpatient PT, OT, SLP to [facility]. She does not have a PCP, [facility] [place]. Patient aware.   Hektor Huston, Chrystine OilerSharley Diane, RN 04/01/2016, 11:15 AM

## 2016-04-01 NOTE — Evaluation (Signed)
Occupational Therapy Evaluation Patient Details Name: Patricia Norman MRN: [id number] DOB: 02/08/1951 Today's Date: 04/01/2016    History of Present Illness 65 y.o. female, with history of hypertension, hyperlipidemia, tobacco use quit 1 year ago came to the hospital with chief complaint of difficulty moving right arm since Friday around 3 AM, the weakness of arm improved but patient also had intermittent blurry vision with slurred speech.  In the ED CT head was done which showed acute nonhemorrhagic left MCA/insular territory infarct.   Clinical Impression   Pt awake, alert, oriented x4 this am, reports she feels improvement in coordination with RUE. Pt completes ADL tasks with supervision to mod independence during session, increased time required for coordination tasks such as manipulating small objects. Pt BUE strength is WFL, slight decrease in right grip strength compared to left. Recommend outpatient OT evaluation and treatment for RUE grip and pinch strength and coordination deficits. No further acute OT services required.     Follow Up Recommendations  Outpatient OT    Equipment Recommendations  None recommended by OT       Precautions / Restrictions Precautions Precautions: Fall Restrictions Weight Bearing Restrictions: No      Mobility Bed Mobility Overal bed mobility: Modified Independent                Transfers Overall transfer level: Modified independent Equipment used: None                       ADL Overall ADL's : Needs assistance/impaired Eating/Feeding: Modified independent;Bed level Eating/Feeding Details (indicate cue type and reason): Increased time for opening packages and manipulating utensils Grooming: Wash/dry hands;Modified independent;Standing                   Toilet Transfer: Modified Independent;RW   Toileting- Clothing Manipulation and Hygiene: Modified independent;Sit to/from stand       Functional mobility  during ADLs: Supervision/safety                 Pertinent Vitals/Pain Pain Assessment: No/denies pain     Hand Dominance Right   Extremity/Trunk Assessment Upper Extremity Assessment Upper Extremity Assessment: RUE deficits/detail RUE Deficits / Details: diminished sensation, coordination is delayed however pt able to complete testing  RUE Coordination: decreased fine motor   Lower Extremity Assessment Lower Extremity Assessment: Defer to PT evaluation       Communication Communication Communication: Expressive difficulties   Cognition Arousal/Alertness: Awake/alert Behavior During Therapy: WFL for tasks assessed/performed Overall Cognitive Status: Impaired/Different from baseline                                Home Living Family/patient expects to be discharged to:: Private residence Living Arrangements: Alone Available Help at Discharge: Family Type of Home: House             Bathroom Shower/Tub: Engineer, civil (consulting)Tub/shower unit   Bathroom Toilet: Standard     Home Equipment: None   Additional Comments: Pt stated that her mother has a BSC, RW, and a cane if she needed to borrow something.       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          OT Problem List: Decreased coordination;Decreased strength    End of Session Equipment Utilized During Treatment: Gait belt  Activity Tolerance: Patient tolerated treatment well Patient  left: in bed;with call bell/phone within reach   Time: 0830-0903 OT Time Calculation (min): 33 min Charges:  OT General Charges $OT Visit: 1 Procedure OT Evaluation $OT Eval Low Complexity: 1 Procedure Ezra Sites, OTR/L  [phone] 04/01/2016, 9:12 AM

## 2016-04-01 NOTE — Plan of Care (Addendum)
Patient IV and tele removed.  Discharge papers given, explained and educated. Told of suggested FU appts and also given scripts. Home  meds returned from pharm.  Patient states to have lost her presciption glasses in MRI - MRI contacted (but no glasses found at this time) - Supervisor notified and safety zone completed. RN assessment and VS revealed stability for DC to home. When ready, patient will be wheeled to front and family transporting home via car

## 2016-04-02 LAB — RPR, QUANT+TP ABS (REFLEX)
Rapid Plasma Reagin, Quant: 1:1 {titer} — ABNORMAL HIGH
TREPONEMA PALLIDUM AB: NEGATIVE

## 2016-04-02 LAB — RPR: RPR Ser Ql: REACTIVE — AB

## 2016-04-02 LAB — HIV ANTIBODY (ROUTINE TESTING W REFLEX): HIV Screen 4th Generation wRfx: NONREACTIVE

## 2016-04-04 LAB — HOMOCYSTEINE: HOMOCYSTEINE-NORM: 7.7 umol/L (ref 0.0–15.0)

## 2016-04-07 ENCOUNTER — Telehealth (HOSPITAL_COMMUNITY): Payer: Self-pay | Admitting: Speech Pathology

## 2016-04-07 ENCOUNTER — Encounter (HOSPITAL_COMMUNITY): Payer: BC Managed Care – PPO | Admitting: Speech Pathology

## 2016-04-07 NOTE — Telephone Encounter (Signed)
Telephone Call  Speech Pathology  Mrs. Patricia Norman did not show for her scheduled outpatient SLP appointment this date. I left a voice mail message on her machine asking her to please call to reschedule or let us know if her needs have changed.  Thank you,  Havery MorosDabney Ishmeal Rorie, CCC-SLP [phone]

## 2016-04-17 ENCOUNTER — Telehealth (HOSPITAL_COMMUNITY): Payer: Self-pay | Admitting: Occupational Therapy

## 2016-04-17 ENCOUNTER — Ambulatory Visit (HOSPITAL_COMMUNITY): Payer: Medicare Other | Attending: Internal Medicine | Admitting: Occupational Therapy

## 2016-04-17 ENCOUNTER — Encounter (HOSPITAL_COMMUNITY): Payer: Self-pay | Admitting: Occupational Therapy

## 2016-04-17 ENCOUNTER — Encounter (HOSPITAL_COMMUNITY): Payer: Self-pay | Admitting: Speech Pathology

## 2016-04-17 ENCOUNTER — Ambulatory Visit (HOSPITAL_COMMUNITY): Payer: Medicare Other | Admitting: Physical Therapy

## 2016-04-17 ENCOUNTER — Ambulatory Visit (HOSPITAL_COMMUNITY): Payer: Medicare Other | Admitting: Speech Pathology

## 2016-04-17 DIAGNOSIS — R41841 Cognitive communication deficit: Secondary | ICD-10-CM

## 2016-04-17 DIAGNOSIS — M6281 Muscle weakness (generalized): Secondary | ICD-10-CM | POA: Diagnosis present

## 2016-04-17 DIAGNOSIS — R2681 Unsteadiness on feet: Secondary | ICD-10-CM

## 2016-04-17 DIAGNOSIS — R278 Other lack of coordination: Secondary | ICD-10-CM

## 2016-04-17 DIAGNOSIS — R29898 Other symptoms and signs involving the musculoskeletal system: Secondary | ICD-10-CM

## 2016-04-17 NOTE — Therapy (Signed)
[place] [facility] [address] [place], [place], [address] Phone: [phone]   Fax:  [phone]  Physical Therapy Evaluation  Patient Details  Name: Patricia Norman MRN: [id number] Date of Birth: 10/31/1950 Referring Provider: Dr. Ranee Gosselinonald Gioffre   Encounter Date: 04/17/2016      PT End of Session - 04/17/16 1738    Visit Number 1   Number of Visits 4   Date for PT Re-Evaluation 05/15/16   Authorization Type Medicare    Authorization Time Period 04/17/16 to 05/15/16   Authorization - Visit Number 1   Authorization - Number of Visits 10   PT Start Time 1610   PT Stop Time 1643   PT Time Calculation (min) 33 min   Equipment Utilized During Treatment Gait belt   Activity Tolerance Patient tolerated treatment well   Behavior During [facility] for tasks assessed/performed      Past Medical History:  Diagnosis Date  . Chest pain   . Depression   . History of constipation   . Hyperlipidemia   . Hypertension   . Migraine headache   . Tobacco abuse     Past Surgical History:  Procedure Laterality Date  . CARDIOVASCULAR STRESS TEST  08/10/2009   EF 67%  . CHOLECYSTECTOMY    . COLONOSCOPY     DR. Dorena CookeyJOHN HAYES  . PARTIAL HYSTERECTOMY    . US ECHOCARDIOGRAPHY  12/15/2003   EF 55-60%    There were no vitals filed for this visit.       Subjective Assessment - 04/17/16 1611    Subjective Patient arrives stating that she had a CVA on October the 17th (mid-October); she waited and went in the next night, [facility]. She was then discharged from the hospital, and was referred to OP PT. She reports that speech is the hardest thing for her right now and her greatest concern; she has noticed some minor changes in her walking/balance but she is not as concerned about this as her speech. Medications do influence her balance. No falls but she does have some trouble with squatting, this is complicated by knee pain as well.    Pertinent  History beta-blockers, HTN, CVA, chronic L knee pain    How long can you sit comfortably? unlimited    How long can you stand comfortably? couple hours    How long can you walk comfortably? 2-3 hours but she is really not sure    Patient Stated Goals improve balance    Currently in Pain? No/denies            [facility] Assessment - 04/17/16 1619      Assessment   Medical Diagnosis CVA    Onset Date/Surgical Date --  October 2017   Next MD Visit Neurologist appointment in November    Prior Therapy PT for her knee in the past, PT eval in acute care      Precautions   Precautions Fall     Restrictions   Weight Bearing Restrictions No     Balance Screen   Has the patient fallen in the past 6 months No   Has the patient had a decrease in activity level because of a fear of falling?  Yes   Is the patient reluctant to leave their home because of a fear of falling?  No     Prior Function   Level of Independence Independent   Vocation Retired   Leisure yardwork-flowers  and landscaping; shopping     Strength   Right Hip Flexion 3/5   Right Hip Extension 3-/5   Right Hip ABduction 3+/5   Left Hip Flexion 4/5   Left Hip Extension 3-/5   Left Hip ABduction 4+/5   Right Knee Flexion 4+/5   Right Knee Extension 4+/5   Left Knee Flexion 4+/5   Left Knee Extension 4+/5   Right Ankle Dorsiflexion 4+/5   Left Ankle Dorsiflexion 4+/5     Ambulation/Gait   Gait Comments reduced gait speed, flexed at hips, mild scissoring and deviation from gait path      6 minute walk test results    Aerobic Endurance Distance Walked 502   Endurance additional comments      Berg Balance Test   Sit to Stand Able to stand without using hands and stabilize independently   Standing Unsupported Able to stand safely 2 minutes   Sitting with Back Unsupported but Feet Supported on Floor or Stool Able to sit safely and securely 2 minutes   Stand to Sit Sits safely with minimal use of hands    Transfers Able to transfer safely, minor use of hands   Standing Unsupported with Eyes Closed Able to stand 10 seconds with supervision   Standing Ubsupported with Feet Together Able to place feet together independently and stand for 1 minute with supervision   From Standing, Reach Forward with Outstretched Arm Can reach forward >12 cm safely (5")   From Standing Position, Pick up Object from Floor Able to pick up shoe safely and easily   From Standing Position, Turn to Look Behind Over each Shoulder Looks behind from both sides and weight shifts well   Turn 360 Degrees Able to turn 360 degrees safely but slowly   Standing Unsupported, Alternately Place Feet on Step/Stool Able to stand independently and safely and complete 8 steps in 20 seconds   Standing Unsupported, One Foot in Front Able to take small step independently and hold 30 seconds   Standing on One Leg Able to lift leg independently and hold equal to or more than 3 seconds   Total Score 47                           PT Education - 04/17/16 1738    Education provided Yes   Education Details prognosis, POC, HEP next session    Person(s) Educated Patient   Methods Explanation   Comprehension Verbalized understanding          PT Short Term Goals - 04/17/16 1743      PT SHORT TERM GOAL #1   Title Patient to be independent in correctly and consistently performing appropriate HEP, to be updated PRN    Time 2   Period Weeks   Status New           PT Long Term Goals - 04/17/16 1744      PT LONG TERM GOAL #1   Title Patient to demonstrate functional strength at least 5/5 in all tested groups in order to improve balance and functional task performance    Time 4   Period Weeks   Status New     PT LONG TERM GOAL #2   Title Patient to score at least a 52 on BERG balance test to demonstrate improved functional task performance skills and reduced fall risk    Time 4   Period Weeks   Status New  Plan - 04/17/16 1742    Clinical Impression Statement Patient arrives status-post CVA which she reports she sustained in mid-October; she reports that she has not noticed a lot of changes in her gait and balance and is most concerned with her speech changes, but wanted to be checked anyway. Upon examination, patient reveals mild strength and gait impairment, with her primary deficit being reduced functional balance as evidenced by the BERG test. She will benefit from a short stint of skilled PT services, to focus on balance, and to build up to advanced HEP for personal use before DC.    Rehab Potential Good   PT Frequency 2x / week   PT Duration 4 weeks   PT Treatment/Interventions ADLs/Self Care Home Management;Biofeedback;Gait training;Stair training;Functional mobility training;Therapeutic activities;Therapeutic exercise;Balance training;Neuromuscular re-education;Patient/family education;Energy conservation;Taping   PT Next Visit Plan assign balance based HEP; focus on balance and developing advanced HEP    Consulted and Agree with Plan of Care Patient      Patient will benefit from skilled therapeutic intervention in order to improve the following deficits and impairments:  Abnormal gait, Decreased coordination, Postural dysfunction, Decreased balance, Decreased safety awareness, Decreased strength  Visit Diagnosis: Muscle weakness (generalized) - Plan: PT plan of care cert/re-cert  Unsteadiness on feet - Plan: PT plan of care cert/re-cert      G-Codes - 04/17/16 1746    Functional Assessment Tool Used Based on skilled clinical assessment of gait, strength, balance    Functional Limitation Mobility: Walking and moving around   Mobility: Walking and Moving Around Current Status ([id number](G8978) At least 20 percent but less than 40 percent impaired, limited or restricted   Mobility: Walking and Moving Around Goal Status ([id number](G8979) 0 percent impaired, limited or restricted        Problem List Patient Active Problem List   Diagnosis Date Noted  . CVA (cerebral vascular accident) (HCC) 03/31/2016  . Cerebrovascular accident (CVA) (HCC) 03/31/2016  . HTN (hypertension) 03/31/2016  . Left knee pain 08/08/2015  . Hypokalemia 10/03/2013  . Sicca syndrome (HCC) 02/21/2011  . Tobacco abuse counseling 02/21/2011  . Benign hypertensive heart disease without heart failure 02/21/2011  . Dyslipidemia 02/21/2011  . ASTHMATIC BRONCHITIS, ACUTE 09/09/2007  . DYSPNEA 09/09/2007  . CHEST PAIN UNSPECIFIED 09/09/2007    Nedra HaiKristen Unger PT, DPT [phone]  [facility] [address] [place], [place], [address] Phone: [phone]   Fax:  [phone]  Name: Patricia Norman MRN: [id number] Date of Birth: 01/20/1951

## 2016-04-17 NOTE — Therapy (Signed)
[place] [facility] [address] [place], [place], [address] Phone: [phone]   Fax:  [phone]  Speech Language Pathology Evaluation  Patient Details  Name: Patricia Norman MRN: [id number] Date of Birth: January 14, 1951 Referring Provider: Dr. Kerry Hough   Encounter Date: 04/17/2016      End of Session - 04/17/16 1628    Visit Number 1   Number of Visits 4   Date for SLP Re-Evaluation 05/15/16   Authorization Type [employer] Medicare    SLP Start Time 1521   SLP Stop Time  1605   SLP Time Calculation (min) 44 min   Activity Tolerance Patient tolerated treatment well      Past Medical History:  Diagnosis Date  . Chest pain   . Depression   . History of constipation   . Hyperlipidemia   . Hypertension   . Migraine headache   . Tobacco abuse     Past Surgical History:  Procedure Laterality Date  . CARDIOVASCULAR STRESS TEST  08/10/2009   EF 67%  . CHOLECYSTECTOMY    . COLONOSCOPY     DR. Dorena Cookey  . PARTIAL HYSTERECTOMY    . US ECHOCARDIOGRAPHY  12/15/2003   EF 55-60%    There were no vitals filed for this visit.      Subjective Assessment - 04/17/16 1622    Subjective Pt. cooperative throughout session today    Special Tests SAGE (Self Administered Gerocognitive Examination)    Currently in Pain? No/denies   Pain Score 0-No pain            SLP Evaluation [facility] - 04/17/16 1622      SLP Visit Information   SLP Received On 04/02/16   Referring Provider Dr. Erick Blinks    Onset Date 03/31/16   Medical Diagnosis CVA   PT/OT/SLP Co-Evaluation/Treatment Yes   SLP goals addressed during session Cognition;Communication     Subjective   Patient/Family Stated Goal "I want my speech to be normal"      General Information   HPI 65 y.o. female, with history of hypertension, hyperlipidemia, tobacco use quit 1 year ago came to the hospital with chief complaint of difficulty moving right arm since Friday around 3 AM, the weakness of  arm improved but patient also had intermittent blurry vision with slurred speech.  In the ED CT head was done which showed acute nonhemorrhagic left MCA/insular territory infarct. Pt. Was discharged on 04/02/2016 and outpatient ST was recommended.      Prior Functional Status   Cognitive/Linguistic Baseline Baseline deficits   Baseline deficit details deficits with speech and cognition post CVA    Type of Home House    Lives With Alone   Available Support Family   Education 12th grade   Vocation Retired     Pain Assessment   Pain Assessment No/denies pain     Cognition   Overall Cognitive Status Impaired/Different from baseline   Area of Impairment Attention   Current Attention Level Alternating   Attention Comments pt. is aware of deficits    Memory Appears intact   Awareness Appears intact   Problem Solving Impaired   Problem Solving Impairment Functional complex   Executive Function Reasoning   Reasoning Impaired   Reasoning Impairment Functional complex     Auditory Comprehension   Overall Auditory Comprehension Appears within functional limits for tasks assessed   Yes/No Questions Within Functional Limits     Expression   Primary Mode of Expression Verbal  Verbal Expression   Overall Verbal Expression Impaired   Initiation No impairment   Level of Generative/Spontaneous Verbalization Sentence;Conversation   Repetition No impairment   Naming No impairment   Pragmatics No impairment   Interfering Components Attention;Speech intelligibility     Oral Motor/Sensory Function   Overall Oral Motor/Sensory Function Appears within functional limits for tasks assessed     Motor Speech   Overall Motor Speech Appears within functional limits for tasks assessed     Standardized Assessments   Standardized Assessments  Other Assessment  SAGE      SLP - End of Session   Patient left in chair            SLP Education - 04/17/16 1626    Education provided Yes    Education Details provided feedback and education to patient regarding test scores and therapy goals; plan to provide carryover activities in upcoming session    Person(s) Educated Patient   Methods Explanation   Comprehension Verbalized understanding          SLP Short Term Goals - 04/17/16 1720      SLP SHORT TERM GOAL #1   Title Pt. will use compensatory strategies to decrease verbal communication breakdowns in 90% of opportunities with minimal assistance.    Baseline 50% acccuracy    Time 4   Period Weeks   Status New     SLP SHORT TERM GOAL #2   Title Pt. will complete executive functioning and reasoning tasks with 90% accuracy with minimal assistance during structured and unstructured activities and minimal assistance.    Baseline 50%    Time 4   Period Weeks   Status New     SLP SHORT TERM GOAL #3   Title Pt. will complete alternating attention tasks with 90% accuracy with no cues during structured and unstrutured tasks.    Baseline 50% accuracy    Time 4   Period Weeks   Status New          SLP Long Term Goals - 04/17/16 1724      SLP LONG TERM GOAL #1   Title Pt. will complete functional, higher-level cognitive tasks with 90% accuracy with min verbal cues.    Baseline 50% accuracy    Time 4   Period Weeks   Status New          Plan - 04/17/16 1731    Clinical Impression Statement 65 y/o female with recent CVA, who lives alone. Presents with mild cognitive-lingusitic deficits as well as mild speech diffiuclty as evidenced by diffiuclty with reasoning tasks, alternating attention tasks,  and frequent self-corrections with speech.  Pt. is aware of deficits. No swallowing difficulty indicated. Pt. goal is to improve speech production. Pt. in agreement with ST goals and plan of care. ST recommends intervention to target above deficits.    Speech Therapy Frequency 2x / week   Duration 4 weeks   Treatment/Interventions Patient/family education;Compensatory  strategies;Cognitive reorganization;Cueing hierarchy;SLP instruction and feedback;Language facilitation   Potential to Achieve Goals Good   SLP Home Exercise Plan plan to provide home exercises during next visit    Consulted and Agree with Plan of Care Patient      Patient will benefit from skilled therapeutic intervention in order to improve the following deficits and impairments:   Cognitive communication deficit      G-Codes - 04/17/16 1631    Functional Assessment Tool Used SAGE, and informal assessment of speech    Functional Limitations  Attention   Spoken Language Comprehension Current Status [phone](G9159) At least 20 percent but less than 40 percent impaired, limited or restricted   Spoken Language Comprehension Goal Status (G9160) At least 1 percent but less than 20 percent impaired, limited or restricted      Problem List Patient Active Problem List   Diagnosis Date Noted  . CVA (cerebral vascular accident) (HCC) 03/31/2016  . Cerebrovascular accident (CVA) (HCC) 03/31/2016  . HTN (hypertension) 03/31/2016  . Left knee pain 08/08/2015  . Hypokalemia 10/03/2013  . Sicca syndrome (HCC) 02/21/2011  . Tobacco abuse counseling 02/21/2011  . Benign hypertensive heart disease without heart failure 02/21/2011  . Dyslipidemia 02/21/2011  . ASTHMATIC BRONCHITIS, ACUTE 09/09/2007  . DYSPNEA 09/09/2007  . CHEST PAIN UNSPECIFIED 09/09/2007    Addison Naegelishley Farzad Tibbetts 04/17/2016, 5:56 PM  [facility] [address] [place], [place], [address] Phone: [phone]   Fax:  [phone]  Name: Patricia Norman MRN: [id number] Date of Birth: 02/01/1951

## 2016-04-17 NOTE — Patient Instructions (Signed)
Home Exercises Program Theraputty Exercises  Do the following exercises 1-2 times a day using your affected hand.  1. Roll putty into a ball.  2. Make into a pancake.  3. Roll putty into a roll.  4. Pinch along log with first finger and thumb.   5. Make into a ball.  6. Roll it back into a log.   7. Pinch using thumb and side of first finger.  8. Roll into a ball, then flatten into a pancake.  9. Using your fingers, make putty into a mountain.    Fine Motor Exercises for Home Practice:  1) Place coins, paperclips, any small object in the right hand and practice moving from palm to fingertips and placing on a table or in a cup one at a time. Focus on moving items individually, not shaking items into fingertips.   2) Finger taps: place right hand on tabletop with fingers spread apart. Lift one finger at a time from table and the place back on tabletop. Complete each finger individually. Once comfortable with exercise, begin to pick up speed with taps.   3) Tweezer use: practice picking up small objects with tweezers and placing in cup.

## 2016-04-17 NOTE — Therapy (Signed)
[facility] [address] [place], [place], [address] Phone: [phone]   Fax:  [phone]  Occupational Therapy Evaluation  Patient Details  Name: [name] MRN: [id number] Date of Birth: 01/28/1951 Referring Provider: Dr. Erick BlinksJehanzeb Memon  Encounter Date: 04/17/2016      OT End of Session - 04/17/16 1607    Visit Number 1   Number of Visits 1   Date for OT Re-Evaluation 04/18/16   Authorization Type U[employer] Medicare   Authorization Time Period Before 10th visit   Authorization - Visit Number 1   Authorization - Number of Visits 10   OT Start Time 1433   OT Stop Time 1512   OT Time Calculation (min) 39 min   Activity Tolerance Patient tolerated treatment well   Behavior During Therapy [facility] for tasks assessed/performed      Past Medical History:  Diagnosis Date  . Chest pain   . Depression   . History of constipation   . Hyperlipidemia   . Hypertension   . Migraine headache   . Tobacco abuse     Past Surgical History:  Procedure Laterality Date  . CARDIOVASCULAR STRESS TEST  08/10/2009   EF 67%  . CHOLECYSTECTOMY    . COLONOSCOPY     DR. Dorena CookeyJOHN HAYES  . PARTIAL HYSTERECTOMY    . US ECHOCARDIOGRAPHY  12/15/2003   EF 55-60%    There were no vitals filed for this visit.      Subjective Assessment - 04/17/16 1604    Subjective  S: I feel like I have improved a lot since I was released from the hospital.    Pertinent History Pt is a 65 y/o female s/p left MCA stroke on 03/31/16 presenting with right sided weakness and coordination difficulties. Pt was referred to occupational therapy for evaluation and treatment by Dr. Erick BlinksJehanzeb Memon.    Patient Stated Goals To be able to do tedious things with less frustration.    Currently in Pain? No/denies           [facility] - 04/17/16 1433      Assessment   Diagnosis Left MCA Stroke   Referring Provider Dr. Erick BlinksJehanzeb Memon   Onset Date 03/31/16   Prior Therapy  Acute OT/PT/SLP     Precautions   Precautions None     Restrictions   Weight Bearing Restrictions No     Balance Screen   Has the patient fallen in the past 6 months No   Has the patient had a decrease in activity level because of a fear of falling?  No   Is the patient reluctant to leave their home because of a fear of falling?  No     Home  Environment   Family/patient expects to be discharged to: Private residence   Living Arrangements Alone     Prior Function   Level of Independence Independent   Vocation Retired   Leisure Print production planneryardwork-flowers and landscaping; shopping     ADL   ADL comments Pt reports difficulty with grasping and holding onto weighted items, putting in earrings, fine motor tasks that are tedious. Pt fatigues easily during ADL tasks.      Written Expression   Dominant Hand Right     Cognition   Overall Cognitive Status Within Functional Limits for tasks assessed     Sensation   Light Touch Appears Intact   Stereognosis Appears Intact   Hot/Cold Appears Intact   Proprioception Appears Intact  Coordination   9 Hole Peg Test Right;Left   Right 9 Hole Peg Test 39.80"   Left 9 Hole Peg Test 37.24"     ROM / Strength   AROM / PROM / Strength Strength     Strength   Strength Assessment Site Shoulder;Hand   Right/Left Shoulder Right   Right Shoulder Flexion 4+/5   Right Shoulder ABduction 4+/5   Right Shoulder Internal Rotation 4+/5   Right Shoulder External Rotation 4+/5   Right/Left hand Right;Left   Right Hand Gross Grasp Functional   Right Hand Grip (lbs) 46   Right Hand Lateral Pinch 10 lbs   Right Hand 3 Point Pinch 10 lbs   Left Hand Gross Grasp Functional   Left Hand Grip (lbs) 46   Left Hand Lateral Pinch 12 lbs   Left Hand 3 Point Pinch 8 lbs                         OT Education - 2016/04/29 1606    Education provided Yes   Education Details green theraputty HEP and fine motor coordination activities   Person(s)  Educated Patient   Methods Explanation;Demonstration;Handout   Comprehension Verbalized understanding;Returned demonstration          OT Short Term Goals - April 29, 2016 1610      OT SHORT TERM GOAL #1   Title Pt will be educated on HEP to improve grip and pinch strength, as well as fine motor coordination.   Time 1   Period Days   Status Achieved                  Plan - 29-Apr-2016 1607    Clinical Impression Statement A: Pt is a 65 y/o female s/p left MCA stroke on 03/31/2016 referred to OT for decreased grip and pinch strength and coordination deficits. Pt demonstrates RUE strength WNL including grip and pinch, fine motor coordination is WFL compared to LUE. Pt is improving in ability to complete tedious ADL tasks at home such as using tweezers. Pt is agreeable to HEP as she is at baseline with functional ADL tasks at this time.    Rehab Potential Good   OT Frequency One time visit   OT Treatment/Interventions Patient/family education   Plan P: Pt educated on green theraputty HEP and fine motor activities for HEP.    OT Home Exercise Plan April 30, 2023: green theraputty, fine motor activities   Consulted and Agree with Plan of Care Patient      Patient will benefit from skilled therapeutic intervention in order to improve the following deficits and impairments:  Decreased coordination, Decreased strength  Visit Diagnosis: Other symptoms and signs involving the musculoskeletal system  Other lack of coordination      G-Codes - April 29, 2016 1611    Functional Assessment Tool Used clinical judgement   Functional Limitation Carrying, moving and handling objects   Carrying, Moving and Handling Objects Goal Status (W0981) At least 1 percent but less than 20 percent impaired, limited or restricted   Carrying, Moving and Handling Objects Discharge Status [phone]) At least 1 percent but less than 20 percent impaired, limited or restricted      Problem List Patient Active Problem List    Diagnosis Date Noted  . CVA (cerebral vascular accident) (HCC) 03/31/2016  . Cerebrovascular accident (CVA) (HCC) 03/31/2016  . HTN (hypertension) 03/31/2016  . Left knee pain 08/08/2015  . Hypokalemia 10/03/2013  . Sicca syndrome (HCC) 02/21/2011  .  Tobacco abuse counseling 02/21/2011  . Benign hypertensive heart disease without heart failure 02/21/2011  . Dyslipidemia 02/21/2011  . ASTHMATIC BRONCHITIS, ACUTE 09/09/2007  . DYSPNEA 09/09/2007  . CHEST PAIN UNSPECIFIED 09/09/2007   Ezra SitesLeslie Troxler, OTR/L  [phone] 04/17/2016, 4:12 PM  [facility] [address] [place], [place], [address] Phone: [phone]   Fax:  [phone]  Name: [name] MRN: [id number] Date of Birth: 08/17/1950

## 2016-04-17 NOTE — Telephone Encounter (Signed)
Called pt, she understands her insurance benefits. Nf 04/17/16  20.00 due for ST/PT Combined per visit. [facility][[employer] MCR 1-1/17 -current No Deduct No auth 20.00 Copay for OT on each visit per Date OOP 4000-255.00 Copay for PT and ST are combined at 20.00 each visit per Date. Spoke to [place] Ref#[id number] NF 04/17/16

## 2016-04-21 ENCOUNTER — Telehealth (HOSPITAL_COMMUNITY): Payer: Self-pay | Admitting: Physical Therapy

## 2016-04-21 ENCOUNTER — Ambulatory Visit (HOSPITAL_COMMUNITY): Payer: Medicare Other | Admitting: Physical Therapy

## 2016-04-21 ENCOUNTER — Ambulatory Visit (HOSPITAL_COMMUNITY): Payer: Medicare Other

## 2016-04-21 NOTE — Telephone Encounter (Signed)
Pt has not slept well last night and wants to cx both apptments for today. She will be here Wed. NF 04/21/16

## 2016-04-23 ENCOUNTER — Encounter (HOSPITAL_COMMUNITY): Payer: Self-pay

## 2016-04-23 ENCOUNTER — Ambulatory Visit (HOSPITAL_COMMUNITY): Payer: Medicare Other

## 2016-04-23 DIAGNOSIS — R41841 Cognitive communication deficit: Secondary | ICD-10-CM

## 2016-04-23 DIAGNOSIS — R29898 Other symptoms and signs involving the musculoskeletal system: Secondary | ICD-10-CM | POA: Diagnosis not present

## 2016-04-23 NOTE — Therapy (Signed)
[facility] [address] [place], [place], [address] Phone: [phone]   Fax:  [phone]  Speech Language Pathology Treatment  Patient Details  Name: Patricia Norman MRN: [id number] Date of Birth: 09/13/1950 Referring Provider: Dr. Kerry HoughMemon   Encounter Date: 04/23/2016      End of Session - 04/23/16 1615    Visit Number 2   Number of Visits 8   Date for SLP Re-Evaluation 05/15/16   Authorization Type UHC Medicare    SLP Start Time 1435   SLP Stop Time  1530   SLP Time Calculation (min) 55 min   Activity Tolerance Patient tolerated treatment well      Past Medical History:  Diagnosis Date  . Chest pain   . Depression   . History of constipation   . Hyperlipidemia   . Hypertension   . Migraine headache   . Tobacco abuse     Past Surgical History:  Procedure Laterality Date  . CARDIOVASCULAR STRESS TEST  08/10/2009   EF 67%  . CHOLECYSTECTOMY    . COLONOSCOPY     DR. Dorena CookeyJOHN HAYES  . PARTIAL HYSTERECTOMY    . US ECHOCARDIOGRAPHY  12/15/2003   EF 55-60%    There were no vitals filed for this visit.      Subjective Assessment - 04/23/16 1608    Subjective Pt. became frustrated at times throughout visit and states she is havign difficulty managing daily tasks    Currently in Pain? No/denies   Multiple Pain Sites No               ADULT SLP TREATMENT - 04/23/16 0001      General Information   Behavior/Cognition Alert;Agitated;Cooperative   Patient Positioning Upright in chair   HPI 65 y.o. female, with history of hypertension, hyperlipidemia, tobacco use quit 1 year ago. 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 Refferred to ST for speech and cognitive evaluation.      Treatment Provided   Treatment provided Cognitive-Linquistic     Pain Assessment   Pain Assessment No/denies pain     Cognitive-Linquistic Treatment   Treatment focused on Cognition   Skilled Treatment home  implementation program, cueing, analysis of alternating attention and problem solving      Assessment / Recommendations / Plan   Plan Continue with current plan of care     Progression Toward Goals   Progression toward goals Progressing toward goals          SLP Education - 04/23/16 1612    Education provided Yes   Education Details Educated pt. about goals, and about home strategies to implement in order to make gains with executive functioning    Person(s) Educated Patient   Methods Explanation;Demonstration   Comprehension Verbalized understanding          SLP Short Term Goals - 04/23/16 1623      SLP SHORT TERM GOAL #1   Title Pt. will use compensatory strategies to decreased verbal communication breakdowns in 90% of opportunities with minimal assistance.    Baseline 50% acccuracy    Time 4   Period Weeks   Status On-going     SLP SHORT TERM GOAL #2   Title Pt. will complete executive functioning and reasoning tasks with 90% accuracy with minimal assistance during structured and unstructured activities and minimal assistance.    Baseline 50%    Time 4   Period Weeks   Status On-going  SLP SHORT TERM GOAL #3   Title Pt. will complete alternating attention tasks with 90% accuracy with no cues during structured and unstrutured tasks.    Baseline 50% accuracy    Time 4   Period Weeks   Status On-going          SLP Long Term Goals - 04/23/16 1623      SLP LONG TERM GOAL #1   Title Pt. will complete functional, higher-level cognitive tasks with 90% accuracy with min verbal cues.    Baseline 50% accuracy    Time 4   Period Weeks   Status On-going          Plan - 04/23/16 1621    Clinical Impression Statement Pt. was seen by ST to target cognition. She completed alternating attention tasks using ipad application with accuracy in 6/10 trials when provided with consistent  prompting,  She completed  a problem solvign tasks regarding conflict resolution (  involving having perscriptions filled) with minimal verbal cueing and prompting to make notes regarding follow up from phone call. Pt. states that's "it's just too much" to deal with  regarding managing callling doctor's offices and having prescriptions filled - clinician discussed use of external memory aids  and asking family for assistance - pt.is somewhat agreeable to this.  Provided pt. with a reading comprehension tasks involving  probelm solving. Plan to continue with ST POC, as deficits persist.   Speech Therapy Frequency 2x / week   Duration 4 weeks   Treatment/Interventions Patient/family education;Compensatory strategies;Cognitive reorganization;Cueing hierarchy;SLP instruction and feedback;Language facilitation;Environmental controls   Potential to Achieve Goals Good   SLP Home Exercise Plan plan to implement a system targeting patient's organization of daily activities    Consulted and Agree with Plan of Care Patient      Patient will benefit from skilled therapeutic intervention in order to improve the following deficits and impairments:   Cognitive communication deficit    Problem List Patient Active Problem List   Diagnosis Date Noted  . CVA (cerebral vascular accident) (HCC) 03/31/2016  . Cerebrovascular accident (CVA) (HCC) 03/31/2016  . HTN (hypertension) 03/31/2016  . Left knee pain 08/08/2015  . Hypokalemia 10/03/2013  . Sicca syndrome (HCC) 02/21/2011  . Tobacco abuse counseling 02/21/2011  . Benign hypertensive heart disease without heart failure 02/21/2011  . Dyslipidemia 02/21/2011  . ASTHMATIC BRONCHITIS, ACUTE 09/09/2007  . DYSPNEA 09/09/2007  . CHEST PAIN UNSPECIFIED 09/09/2007   Thank you,   Danella MaiersAshley M. Orvan Falconerampbell, MS, CCC-SLP  Speech-Language Pathologist [phone]      Addison Naegelishley Mareon Robinette 04/23/2016, 4:46 PM  [place] [address] [place], [place], [address] Phone: [phone]   Fax:   [phone]   Name: Patricia Norman MRN: [id number] Date of Birth: 09/14/1950

## 2016-04-29 ENCOUNTER — Ambulatory Visit (HOSPITAL_COMMUNITY): Payer: Medicare Other

## 2016-04-29 ENCOUNTER — Encounter (HOSPITAL_COMMUNITY): Payer: Self-pay

## 2016-04-29 VITALS — BP 118/87 | HR 78

## 2016-04-29 DIAGNOSIS — R2681 Unsteadiness on feet: Secondary | ICD-10-CM

## 2016-04-29 DIAGNOSIS — R29898 Other symptoms and signs involving the musculoskeletal system: Secondary | ICD-10-CM

## 2016-04-29 DIAGNOSIS — R41841 Cognitive communication deficit: Secondary | ICD-10-CM

## 2016-04-29 DIAGNOSIS — M6281 Muscle weakness (generalized): Secondary | ICD-10-CM

## 2016-04-29 NOTE — Therapy (Signed)
[facility] [address] [place], [place], [address] Phone: [phone]   Fax:  [phone]  Physical Therapy Treatment  Patient Details  Name: Patricia Norman MRN: [id number] Date of Birth: 05/03/1951 Referring Provider: Dr. Ranee Gosselinonald Gioffre   Encounter Date: 04/29/2016      PT End of Session - 04/29/16 1400    Visit Number 2   Number of Visits 4   Date for PT Re-Evaluation 05/15/16   Authorization Type Medicare    Authorization Time Period 04/17/16 to 05/15/16   Authorization - Visit Number 2   Authorization - Number of Visits 10   PT Start Time 1355   PT Stop Time 1433   PT Time Calculation (min) 38 min   Equipment Utilized During Treatment Gait belt   Activity Tolerance Patient tolerated treatment well   Behavior During Therapy [facility] for tasks assessed/performed      Past Medical History:  Diagnosis Date  . Chest pain   . Depression   . History of constipation   . Hyperlipidemia   . Hypertension   . Migraine headache   . Tobacco abuse     Past Surgical History:  Procedure Laterality Date  . CARDIOVASCULAR STRESS TEST  08/10/2009   EF 67%  . CHOLECYSTECTOMY    . COLONOSCOPY     DR. Dorena CookeyJOHN HAYES  . PARTIAL HYSTERECTOMY    . US ECHOCARDIOGRAPHY  12/15/2003   EF 55-60%    Vitals:   04/29/16 1704  BP: 118/87  Pulse: 78        Subjective Assessment - 04/29/16 1354    Subjective Pt stated she isn't feel that good day, no real pain just uncomfortable.  No reports of recent falls.     Pertinent History beta-blockers, HTN, CVA, chronic L knee pain    Patient Stated Goals improve balance    Currently in Pain? No/denies            [facility] Adult PT Treatment/Exercise - 04/29/16 0001      Exercises   Exercises Knee/Hip     Knee/Hip Exercises: Standing   Heel Raises 15 reps   Heel Raises Limitations Toe raises   Other Standing Knee Exercises sidestep RTB 2RT   Other Standing Knee Exercises tandem stance 3x 30"     Knee/Hip Exercises: Supine   Bridges 15 reps     Knee/Hip Exercises: Sidelying   Clams 10x 5" with RTB BLE                PT Education - 04/29/16 1702    Education provided Yes   Education Details Reviewed eval findings and goals, establised HEP, copy of eval given to pt   Person(s) Educated Patient   Methods Explanation;Demonstration;Handout   Comprehension Verbalized understanding;Returned demonstration;Need further instruction          PT Short Term Goals - 04/17/16 1743      PT SHORT TERM GOAL #1   Title Patient to be independent in correctly and consistently performing appropriate HEP, to be updated PRN    Time 2   Period Weeks   Status New           PT Long Term Goals - 04/17/16 1744      PT LONG TERM GOAL #1   Title Patient to demonstrate functional strength at least 5/5 in all tested groups in order to improve balance and functional task performance    Time 4   Period Weeks   Status  New     PT LONG TERM GOAL #2   Title Patient to score at least a 52 on BERG balance test to demonstrate improved functional task performance skills and reduced fall risk    Time 4   Period Weeks   Status New               Plan - 04/29/16 1457    Clinical Impression Statement Reviewed goals, established HEP and copy of eval given to pt.  Session focus on improving proximal musculature strengthening with HEP exercises and balance training.  Min A required for LOB episodes with balance training.  Pt c/o dizziness near EOS, pt sat in chair, given water and vitals assessed.  Discussion held with frequency of dizziness, pt encouraged to call MD to discussion current medication and symptoms.  Pt waas limited by fatigue with activity, no reports of pain    Rehab Potential Good   PT Frequency 2x / week   PT Duration 4 weeks   PT Treatment/Interventions ADLs/Self Care Home Management;Biofeedback;Gait training;Stair training;Functional mobility training;Therapeutic  activities;Therapeutic exercise;Balance training;Neuromuscular re-education;Patient/family education;Energy conservation;Taping   PT Next Visit Plan Assess complaince wiht HEP.  Focus on balance and improving activity tolerance as pt. does fatigue easily.     PT Home Exercise Plan Given 11/21:  Bridges, clam, sidestep with RTB, tandem stance      Patient will benefit from skilled therapeutic intervention in order to improve the following deficits and impairments:  Abnormal gait, Decreased coordination, Postural dysfunction, Decreased balance, Decreased safety awareness, Decreased strength  Visit Diagnosis: Muscle weakness (generalized)  Unsteadiness on feet  Other symptoms and signs involving the musculoskeletal system     Problem List Patient Active Problem List   Diagnosis Date Noted  . CVA (cerebral vascular accident) (HCC) 03/31/2016  . Cerebrovascular accident (CVA) (HCC) 03/31/2016  . HTN (hypertension) 03/31/2016  . Left knee pain 08/08/2015  . Hypokalemia 10/03/2013  . Sicca syndrome (HCC) 02/21/2011  . Tobacco abuse counseling 02/21/2011  . Benign hypertensive heart disease without heart failure 02/21/2011  . Dyslipidemia 02/21/2011  . ASTHMATIC BRONCHITIS, ACUTE 09/09/2007  . DYSPNEA 09/09/2007  . CHEST PAIN UNSPECIFIED 09/09/2007   Becky Saxasey Nida Manfredi, LPTA; CBIS [phone]  Juel BurrowCockerham, Yoshiko Keleher Jo 04/29/2016, 5:06 PM  [facility] [address] [place], [place], [address] Phone: [phone]   Fax:  [phone]  Name: Patricia Norman MRN: [id number] Date of Birth: 05/23/1951

## 2016-04-29 NOTE — Therapy (Addendum)
[place] [facility] [address] [place], [place], [address] Phone: [phone]   Fax:  [phone]  Speech Language Pathology Treatment  Patient Details  Name: Patricia BladeDeborah Myer MRN: [id number] Date of Birth: 05/29/1951 Referring Provider: Dr. Kerry HoughMemon   Encounter Date: 04/29/2016      End of Session - 04/29/16 1546    Visit Number 3   Number of Visits 8   Date for SLP Re-Evaluation 05/15/16   Authorization Type U[employer] Medicare    SLP Start Time 1434   SLP Stop Time  1519   SLP Time Calculation (min) 45 min   Activity Tolerance Patient tolerated treatment well      Past Medical History:  Diagnosis Date  . Chest pain   . Depression   . History of constipation   . Hyperlipidemia   . Hypertension   . Migraine headache   . Tobacco abuse     Past Surgical History:  Procedure Laterality Date  . CARDIOVASCULAR STRESS TEST  08/10/2009   EF 67%  . CHOLECYSTECTOMY    . COLONOSCOPY     DR. Dorena CookeyJOHN HAYES  . PARTIAL HYSTERECTOMY    . US ECHOCARDIOGRAPHY  12/15/2003   EF 55-60%    There were no vitals filed for this visit.      Subjective Assessment - 04/29/16 1540    Subjective Pt. demonstrated strong participation throughout visit   Currently in Pain? Yes   Pain Score 2    Pain Location Head   Pain Descriptors / Indicators Aching   Pain Onset Today   Pain Frequency Constant   Pain Relieving Factors pt. took medication for headache during session    Effect of Pain on Daily Activities minimal during therapy session    Multiple Pain Sites No               ADULT SLP TREATMENT - 04/29/16 0001      General Information   Behavior/Cognition Cooperative;Alert   Patient Positioning Upright in chair   HPI 65 y.o. female, with history of hypertension, hyperlipidemia, tobacco use quit 1 year ago. 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 Refferred to ST for speech and cognitive evaluation.      Treatment  Provided   Treatment provided Cognitive-Linquistic     Pain Assessment   Pain Assessment No/denies pain     Cognitive-Linquistic Treatment   Treatment focused on Cognition   Skilled Treatment home implementation program, cueing, analysis of alternating attention and problem solving      Assessment / Recommendations / Plan   Plan Continue with current plan of care          SLP Education - 04/29/16 1543    Education provided Yes   Education Details Provided education regarding organizing and managing home taskss (calander use, making a list)    Person(s) Educated Patient   Methods Explanation   Comprehension Verbalized understanding          SLP Short Term Goals - 04/29/16 1601      SLP SHORT TERM GOAL #1   Title Pt. will use compensatory strategies to decreased verbal communication breakdowns in 90% of opportunities with minimal assistance.    Baseline 50% acccuracy    Time 4   Period Weeks   Status On-going     SLP SHORT TERM GOAL #2   Title Pt. will complete executive functioning and reasoning tasks with 90% accuracy with minimal assistance during structured and unstructured activities and minimal  assistance.    Baseline 50%    Time 4   Period Weeks   Status On-going     SLP SHORT TERM GOAL #3   Title Pt. will complete alternating attention tasks with 90% accuracy with no cues during structured and unstrutured tasks.    Baseline 50% accuracy    Time 4   Period Weeks   Status On-going          SLP Long Term Goals - 04/29/16 1601      SLP LONG TERM GOAL #1   Title Pt. will complete functional, higher-level cognitive tasks with 90% accuracy with min verbal cues.    Baseline 50% accuracy    Time 4   Period Weeks   Status On-going          Plan - 04/29/16 1551    Clinical Impression Statement Pt. was cooperative and demonstrated strong participation throughout visit. She did complain of headache and dizziness - [facility]. She  recalled 3/3 words with 5, 10, and 15 minute delay with no diffiuclty. She completed worksheet targeting planning and problem solving tasks  with 70% accuracy given mod verbal cues.  She completed alternaing attention tasks with accuracy in 7/10 trials.  Noted several self corrections in sequencing  when patient made her coffee and  gathered materials from therapy session. She asked if therapy can get a walker for her to use at home - plan to speak with  physical therapy about this. Pt. stated that her headache may be due decreased meal intake today- SLP provided her with crackers at the end of visit.  Provided pt. with carryover assignment targeting sequencing and daily schedules. Pt. continues to present with cognitive deficits-continue to see her for ST.     Speech Therapy Frequency 2x / week   Duration 4 weeks   Treatment/Interventions Patient/family education;Compensatory strategies;Cognitive reorganization;Cueing hierarchy;SLP instruction and feedback;Language facilitation;Environmental controls   Potential to Achieve Goals Good   SLP Home Exercise Plan plan to implement a system targeting patient's organization of daily activities    Consulted and Agree with Plan of Care Patient      Patient will benefit from skilled therapeutic intervention in order to improve the following deficits and impairments:   Cognitive communication deficit    Problem List Patient Active Problem List   Diagnosis Date Noted  . CVA (cerebral vascular accident) (HCC) 03/31/2016  . Cerebrovascular accident (CVA) (HCC) 03/31/2016  . HTN (hypertension) 03/31/2016  . Left knee pain 08/08/2015  . Hypokalemia 10/03/2013  . Sicca syndrome (HCC) 02/21/2011  . Tobacco abuse counseling 02/21/2011  . Benign hypertensive heart disease without heart failure 02/21/2011  . Dyslipidemia 02/21/2011  . ASTHMATIC BRONCHITIS, ACUTE 09/09/2007  . DYSPNEA 09/09/2007  . CHEST PAIN UNSPECIFIED 09/09/2007    Thank you,    Danella MaiersAshley M. Orvan Falconerampbell, MS, CCC-SLP  Speech-Language Pathologist [phone]     Addison Naegelishley Elnore Cosens 04/29/2016, 4:09 PM  [place] [facility] [address] [place], [place], [address] Phone: [phone]   Fax:  [phone]   Name: Patricia Norman MRN: [id number] Date of Birth: 03/17/1951

## 2016-04-29 NOTE — Patient Instructions (Signed)
Bridge    Lie back, legs bent. Inhale, pressing hips up. Keeping ribs in, lengthen lower back. Exhale, rolling down along spine from top. Repeat 15 times. Do 2 sessions per day.  http://pm.exer.us/55   Copyright  VHI. All rights reserved.   Abduction: Clam (Eccentric) - Side-Lying    Lie on side with knees bent with theraband tied around knees. Lift top knee, keeping feet together. Keep trunk steady. Slowly lower for 3-5 seconds.10 reps per set, 1-2 sets per day, at least 4 days per week.  http://ecce.exer.us/65   Copyright  VHI. All rights reserved.   Band Walk: Side Stepping    Tie band around legs, just above knees. Step 10 feet to one side, then step back to start. Repeat 10 feet per session. Note: Small towel between band and skin eases rubbing.  http://plyo.exer.us/76   Copyright  VHI. All rights reserved.   Tandem Stance    Right foot in front of left, heel touching toe both feet "straight ahead". Stand on Foot Triangle of Support with both feet. Balance in this position 30 seconds. Do with left foot in front of right.  Copyright  VHI. All rights reserved.

## 2016-04-30 ENCOUNTER — Encounter (HOSPITAL_COMMUNITY): Payer: Medicare Other

## 2016-05-06 ENCOUNTER — Ambulatory Visit (HOSPITAL_COMMUNITY): Payer: Medicare Other

## 2016-05-07 ENCOUNTER — Encounter (HOSPITAL_COMMUNITY): Payer: Self-pay

## 2016-05-07 ENCOUNTER — Ambulatory Visit (HOSPITAL_COMMUNITY): Payer: Medicare Other

## 2016-05-07 DIAGNOSIS — R29898 Other symptoms and signs involving the musculoskeletal system: Secondary | ICD-10-CM

## 2016-05-07 DIAGNOSIS — R41841 Cognitive communication deficit: Secondary | ICD-10-CM

## 2016-05-07 DIAGNOSIS — R2681 Unsteadiness on feet: Secondary | ICD-10-CM

## 2016-05-07 DIAGNOSIS — M6281 Muscle weakness (generalized): Secondary | ICD-10-CM

## 2016-05-07 NOTE — Therapy (Signed)
[facility] [facility] [address] [place], [place], [address] Phone: [phone]   Fax:  [phone]  Physical Therapy Treatment  Patient Details  Name: Patricia Norman MRN: [id number] Date of Birth: 05/31/1951 Referring Provider: Dr. Ranee Gosselinonald Gioffre   Encounter Date: 05/07/2016      PT End of Session - 05/07/16 1358    Visit Number 3   Number of Visits 4   Date for PT Re-Evaluation 05/15/16   Authorization Type Medicare    Authorization Time Period 04/17/16 to 05/15/16   Authorization - Visit Number 3   Authorization - Number of Visits 10   PT Start Time 1351   PT Stop Time 1432   PT Time Calculation (min) 41 min   Equipment Utilized During Treatment Gait belt   Activity Tolerance Patient tolerated treatment well   Behavior During Therapy [facility] for tasks assessed/performed      Past Medical History:  Diagnosis Date  . Chest pain   . Depression   . History of constipation   . Hyperlipidemia   . Hypertension   . Migraine headache   . Tobacco abuse     Past Surgical History:  Procedure Laterality Date  . CARDIOVASCULAR STRESS TEST  08/10/2009   EF 67%  . CHOLECYSTECTOMY    . COLONOSCOPY     DR. Dorena CookeyJOHN HAYES  . PARTIAL HYSTERECTOMY    . US ECHOCARDIOGRAPHY  12/15/2003   EF 55-60%    There were no vitals filed for this visit.      Subjective Assessment - 05/07/16 1357    Subjective Pt stated she is feeling good today.  Reports non compliance with HEP partially due to holiday weekend and partially due to increased sleeping.  Pt stated she has difficulty picking up objects in her garden, no reports of recent falls.   Pertinent History beta-blockers, HTN, CVA, chronic L knee pain    Patient Stated Goals improve balance    Currently in Pain? No/denies                  [facility] Adult PT Treatment/Exercise - 05/07/16 0001      Knee/Hip Exercises: Standing   Heel Raises 15 reps   Heel Raises Limitations Toe raises   Functional  Squat 5 reps   Functional Squat Limitations lifting yellow ball from floor             Balance Exercises - 05/07/16 1456      Balance Exercises: Standing   Tandem Stance Eyes open;Foam/compliant surface;3 reps;30 secs   SLS Eyes open;3 reps  Rt 12", Lt 7" max of 3   Rockerboard Anterior/posterior;Lateral;Intermittent UE support  2 minute   Tandem Gait 2 reps   Sidestepping 2 reps;Theraband  RTB   Heel Raises Limitations 15x with minimal HHA   Toe Raise Limitations 15x with minimal HHA   Sit to Stand Time 10x with eccentric control           PT Education - 05/07/16 1405    Education provided Yes   Education Details Strategies to assist with reminders to complete HEP, encouraged pt to call MD about new medication and increased sleeping   Person(s) Educated Patient   Methods Explanation   Comprehension Verbalized understanding;Returned demonstration;Need further instruction          PT Short Term Goals - 04/17/16 1743      PT SHORT TERM GOAL #1   Title Patient to be independent in correctly and consistently performing  appropriate HEP, to be updated PRN    Time 2   Period Weeks   Status New           PT Long Term Goals - 04/17/16 1744      PT LONG TERM GOAL #1   Title Patient to demonstrate functional strength at least 5/5 in all tested groups in order to improve balance and functional task performance    Time 4   Period Weeks   Status New     PT LONG TERM GOAL #2   Title Patient to score at least a 52 on BERG balance test to demonstrate improved functional task performance skills and reduced fall risk    Time 4   Period Weeks   Status New               Plan - 05/07/16 1407    Clinical Impression Statement Began session reviewing importance of compliance with HEP, educated strategies to remember daily and educated pt to call MD about change in medication wiht increased sleeping.  Session focus on improving balance training with min A required  for LOB episodes for safety.  Added functional squats to improve form and technqiues to increase ease picking up objects from garden.  No reports of pian through session, was limited by fatigue.   Rehab Potential Good   PT Frequency 2x / week   PT Duration 4 weeks   PT Treatment/Interventions ADLs/Self Care Home Management;Biofeedback;Gait training;Stair training;Functional mobility training;Therapeutic activities;Therapeutic exercise;Balance training;Neuromuscular re-education;Patient/family education;Energy conservation;Taping   PT Next Visit Plan Assess complaince wiht HEP.  Focus on balance and improving activity tolerance as pt. does fatigue easily.        Patient will benefit from skilled therapeutic intervention in order to improve the following deficits and impairments:  Abnormal gait, Decreased coordination, Postural dysfunction, Decreased balance, Decreased safety awareness, Decreased strength  Visit Diagnosis: Muscle weakness (generalized)  Unsteadiness on feet  Other symptoms and signs involving the musculoskeletal system     Problem List Patient Active Problem List   Diagnosis Date Noted  . CVA (cerebral vascular accident) (HCC) 03/31/2016  . Cerebrovascular accident (CVA) (HCC) 03/31/2016  . HTN (hypertension) 03/31/2016  . Left knee pain 08/08/2015  . Hypokalemia 10/03/2013  . Sicca syndrome (HCC) 02/21/2011  . Tobacco abuse counseling 02/21/2011  . Benign hypertensive heart disease without heart failure 02/21/2011  . Dyslipidemia 02/21/2011  . ASTHMATIC BRONCHITIS, ACUTE 09/09/2007  . DYSPNEA 09/09/2007  . CHEST PAIN UNSPECIFIED 09/09/2007   Becky Saxasey Rian Busche, LPTA; CBIS [phone]  Juel BurrowCockerham, Deannah Rossi Jo 05/07/2016, 3:07 PM  [place] [facility] [address] [place], [place], [address] Phone: [phone]   Fax:  [phone]  Name: Patricia Norman MRN: [id number] Date of Birth: 10/23/1950

## 2016-05-07 NOTE — Therapy (Addendum)
[place] [facility] [address] [place], [place], [address] Phone: [phone]   Fax:  [phone]  Speech Language Pathology Treatment  Patient Details  Name: Patricia BladeDeborah Sparkman MRN: [id number] Date of Birth: 09/26/1950 Referring Provider: Dr. Kerry HoughMemon   Encounter Date: 05/07/2016      End of Session - 05/07/16 1555    Visit Number 4   Number of Visits 8   Date for SLP Re-Evaluation 05/15/16   Authorization Type UHC Medicare    Activity Tolerance Patient tolerated treatment well    45 minutes- treatment time   Past Medical History:  Diagnosis Date  . Chest pain   . Depression   . History of constipation   . Hyperlipidemia   . Hypertension   . Migraine headache   . Tobacco abuse     Past Surgical History:  Procedure Laterality Date  . CARDIOVASCULAR STRESS TEST  08/10/2009   EF 67%  . CHOLECYSTECTOMY    . COLONOSCOPY     DR. Dorena CookeyJOHN HAYES  . PARTIAL HYSTERECTOMY    . US ECHOCARDIOGRAPHY  12/15/2003   EF 55-60%    There were no vitals filed for this visit.      Subjective Assessment - 05/07/16 1552    Subjective Pt. states "I think my speech is improving but I'm trouble keeping up with exercises"    Currently in Pain? No/denies                 SLP Education - 05/07/16 1554    Education provided Yes   Education Details educated pt. reagarding strategies to organize important papers    Person(s) Educated Patient   Methods Explanation   Comprehension Verbalized understanding          SLP Short Term Goals - 05/07/16 1603      SLP SHORT TERM GOAL #1   Title Pt. will use compensatory strategies to decreased verbal communication breakdowns in 90% of opportunities with minimal assistance.    Baseline 50% acccuracy    Time 4   Period Weeks   Status On-going     SLP SHORT TERM GOAL #2   Title Pt. will complete executive functioning and reasoning tasks with 90% accuracy with minimal assistance during structured and  unstructured activities and minimal assistance.    Baseline 50%    Time 4   Period Weeks   Status On-going     SLP SHORT TERM GOAL #3   Title Pt. will complete alternating attention tasks with 90% accuracy with no cues during structured and unstrutured tasks.    Baseline 50% accuracy    Time 4   Period Weeks   Status On-going          SLP Long Term Goals - 05/07/16 1604      SLP LONG TERM GOAL #1   Title Pt. will complete functional, higher-level cognitive tasks with 90% accuracy with min verbal cues.    Baseline 50% accuracy    Time 4   Period Weeks   Status On-going          Plan - 05/07/16 1555    Clinical Impression Statement Pt. demonstrated strong participation during visit. She completed altenating attention tasks accurately in 6/10 trials using a computer app, she completed deductive reasoning for word selection with  80% accuracy and mod-max verbal cues, she used slow rate of speech and pacing strategy today to decrease communiation breakdowns with accuracy in 8/10 trials. Introduced phone alarm reminder for completion  of therapy exercises and ST carryover activities, pt. demonstrated use of cell phone alarm with no difficulty. Also implemented use of homework/exercise folder as she that she misplaces exercises. Pt. continues to present with cognitive-linguistics - continue ST intervention.  GENERAL PROGRESS:  Pt. Has made strong progress with speech therapy intervention. She responds well to verbal and visual cues for completion of alternating attention which increased from 50% accuracy 80% accuracy , deductive reasoning which increased from 50% accuracy to 80% accuracy, and use of compensatory strategies to decrease communication which has increased from 50% accuracy to 80% accuracy. Although she has demonstrated strong progress, continued ST is recommended to target deficits.      Speech Therapy Frequency 2x / week   Duration 4 weeks   Treatment/Interventions  Patient/family education;Compensatory strategies;Cognitive reorganization;Cueing hierarchy;SLP instruction and feedback;Language facilitation;Environmental controls   Potential to Achieve Goals Good   SLP Home Exercise Plan provided a deductive reasoning tasks involving word finding    Consulted and Agree with Plan of Care Patient      Patient will benefit from skilled therapeutic intervention in order to improve the following deficits and impairments:   Cognitive communication deficit    Problem List Patient Active Problem List   Diagnosis Date Noted  . CVA (cerebral vascular accident) (HCC) 03/31/2016  . Cerebrovascular accident (CVA) (HCC) 03/31/2016  . HTN (hypertension) 03/31/2016  . Left knee pain 08/08/2015  . Hypokalemia 10/03/2013  . Sicca syndrome (HCC) 02/21/2011  . Tobacco abuse counseling 02/21/2011  . Benign hypertensive heart disease without heart failure 02/21/2011  . Dyslipidemia 02/21/2011  . ASTHMATIC BRONCHITIS, ACUTE 09/09/2007  . DYSPNEA 09/09/2007  . CHEST PAIN UNSPECIFIED 09/09/2007    Thank you,   Danella MaiersAshley M. Orvan Falconerampbell, MS, CCC-SLP  Speech-Language Pathologist [phone]     Addison Naegelishley Shantina Chronister 05/07/2016, 4:06 PM  [place] [facility] [address] [place], [place], [address] Phone: [phone]   Fax:  [phone]   Name: Patricia BladeDeborah Nichol MRN: [id number] Date of Birth: 09/15/1950

## 2016-05-09 ENCOUNTER — Ambulatory Visit (HOSPITAL_COMMUNITY): Payer: Medicare Other

## 2016-05-13 ENCOUNTER — Ambulatory Visit (HOSPITAL_COMMUNITY): Payer: Medicare Other | Attending: Internal Medicine

## 2016-05-13 ENCOUNTER — Telehealth (HOSPITAL_COMMUNITY): Payer: Self-pay

## 2016-05-13 ENCOUNTER — Ambulatory Visit (HOSPITAL_COMMUNITY): Payer: Medicare Other

## 2016-05-13 NOTE — Telephone Encounter (Signed)
No show, called and spoke to pt who had forgotten about apt scheduled for today.  Pt reminded next apt date and time with contact info given.    [address]Fidelis Loth, LPTA; CBIS [phone]

## 2016-05-14 ENCOUNTER — Ambulatory Visit (HOSPITAL_COMMUNITY): Payer: Medicare Other

## 2016-05-14 ENCOUNTER — Ambulatory Visit (HOSPITAL_COMMUNITY): Payer: Medicare Other | Admitting: Physical Therapy

## 2016-05-14 ENCOUNTER — Telehealth (HOSPITAL_COMMUNITY): Payer: Self-pay | Admitting: Physical Therapy

## 2016-05-14 ENCOUNTER — Telehealth (HOSPITAL_COMMUNITY): Payer: Self-pay

## 2016-05-14 NOTE — Telephone Encounter (Signed)
She is sick, reschedule for Jan 2- pt choice

## 2016-05-14 NOTE — Telephone Encounter (Signed)
Spoke with pt. regarding re-cert for ST continuation of tx- pt. is in agreement

## 2016-05-14 NOTE — Addendum Note (Signed)
Addended by: Cristy HiltsAMPBELL, Ishi Danser M on: 05/14/2016 04:05 PM   Modules accepted: Orders

## 2016-05-15 ENCOUNTER — Ambulatory Visit (HOSPITAL_COMMUNITY): Payer: Medicare Other | Admitting: Physical Therapy

## 2016-05-19 ENCOUNTER — Ambulatory Visit (HOSPITAL_COMMUNITY): Payer: Medicare Other

## 2016-05-19 NOTE — Telephone Encounter (Signed)
Contacted pt. Re: no show, she stated that she slept through her alarm, and therefore missed her therapy visit. Pt. [place]. She also [facility] lost [facility] (which was provided) from hospitalization. Pt. Will plan on returning to therapy  on 06/10/16 for ST. Pt. States that she wishes to continue with PT and ST.  Thank you,   Danella Maiersshley M. Orvan Falconerampbell, MS, CCC-SLP  Speech-Language Pathologist [phone]

## 2016-05-20 ENCOUNTER — Ambulatory Visit (HOSPITAL_COMMUNITY): Payer: Medicare Other | Admitting: Physical Therapy

## 2016-05-22 ENCOUNTER — Ambulatory Visit (HOSPITAL_COMMUNITY): Payer: Medicare Other | Admitting: Physical Therapy

## 2016-06-06 ENCOUNTER — Telehealth (HOSPITAL_COMMUNITY): Payer: Self-pay | Admitting: General Practice

## 2016-06-06 NOTE — Telephone Encounter (Signed)
06/06/16 we rescheduled the 1/2 appt to 1/4 because she said that she would be staying with her daughter until after New Years

## 2016-06-10 ENCOUNTER — Ambulatory Visit (HOSPITAL_COMMUNITY): Payer: Medicare Other | Admitting: Physical Therapy

## 2016-06-12 ENCOUNTER — Telehealth (HOSPITAL_COMMUNITY): Payer: Self-pay | Admitting: Physical Therapy

## 2016-06-12 ENCOUNTER — Ambulatory Visit (HOSPITAL_COMMUNITY): Payer: Medicare Other | Admitting: Physical Therapy

## 2016-06-12 NOTE — Telephone Encounter (Signed)
She has a migraine and can not come today

## 2016-06-16 ENCOUNTER — Ambulatory Visit (HOSPITAL_COMMUNITY): Payer: Medicare Other | Admitting: Physical Therapy

## 2016-06-27 ENCOUNTER — Telehealth (HOSPITAL_COMMUNITY): Payer: Self-pay | Admitting: General Practice

## 2016-06-27 NOTE — Telephone Encounter (Signed)
06/27/16 I called pt because she had cx her last appt which was a re-eval appt.  She said that [facility] and see [facility].  Patient said that she was getting discouraged and doesn't think therapy is helping her.

## 2016-06-30 ENCOUNTER — Telehealth (HOSPITAL_COMMUNITY): Payer: Self-pay | Admitting: Physical Therapy

## 2016-06-30 NOTE — Telephone Encounter (Signed)
Returned patient's phone call; no answer, left message giving number of clinic front desk if patient feels she still needs to talk to PT.   Nedra HaiKristen Adarryl Goldammer PT, DPT [phone]

## 2017-12-07 IMAGING — CR DG CHEST 2V
2 series · 2 of 2 positions shown · non-contrast
Comparison: None.

CLINICAL DATA: Stroke, hypertension.

EXAM:
CHEST  2 VIEW

[w chest pa]
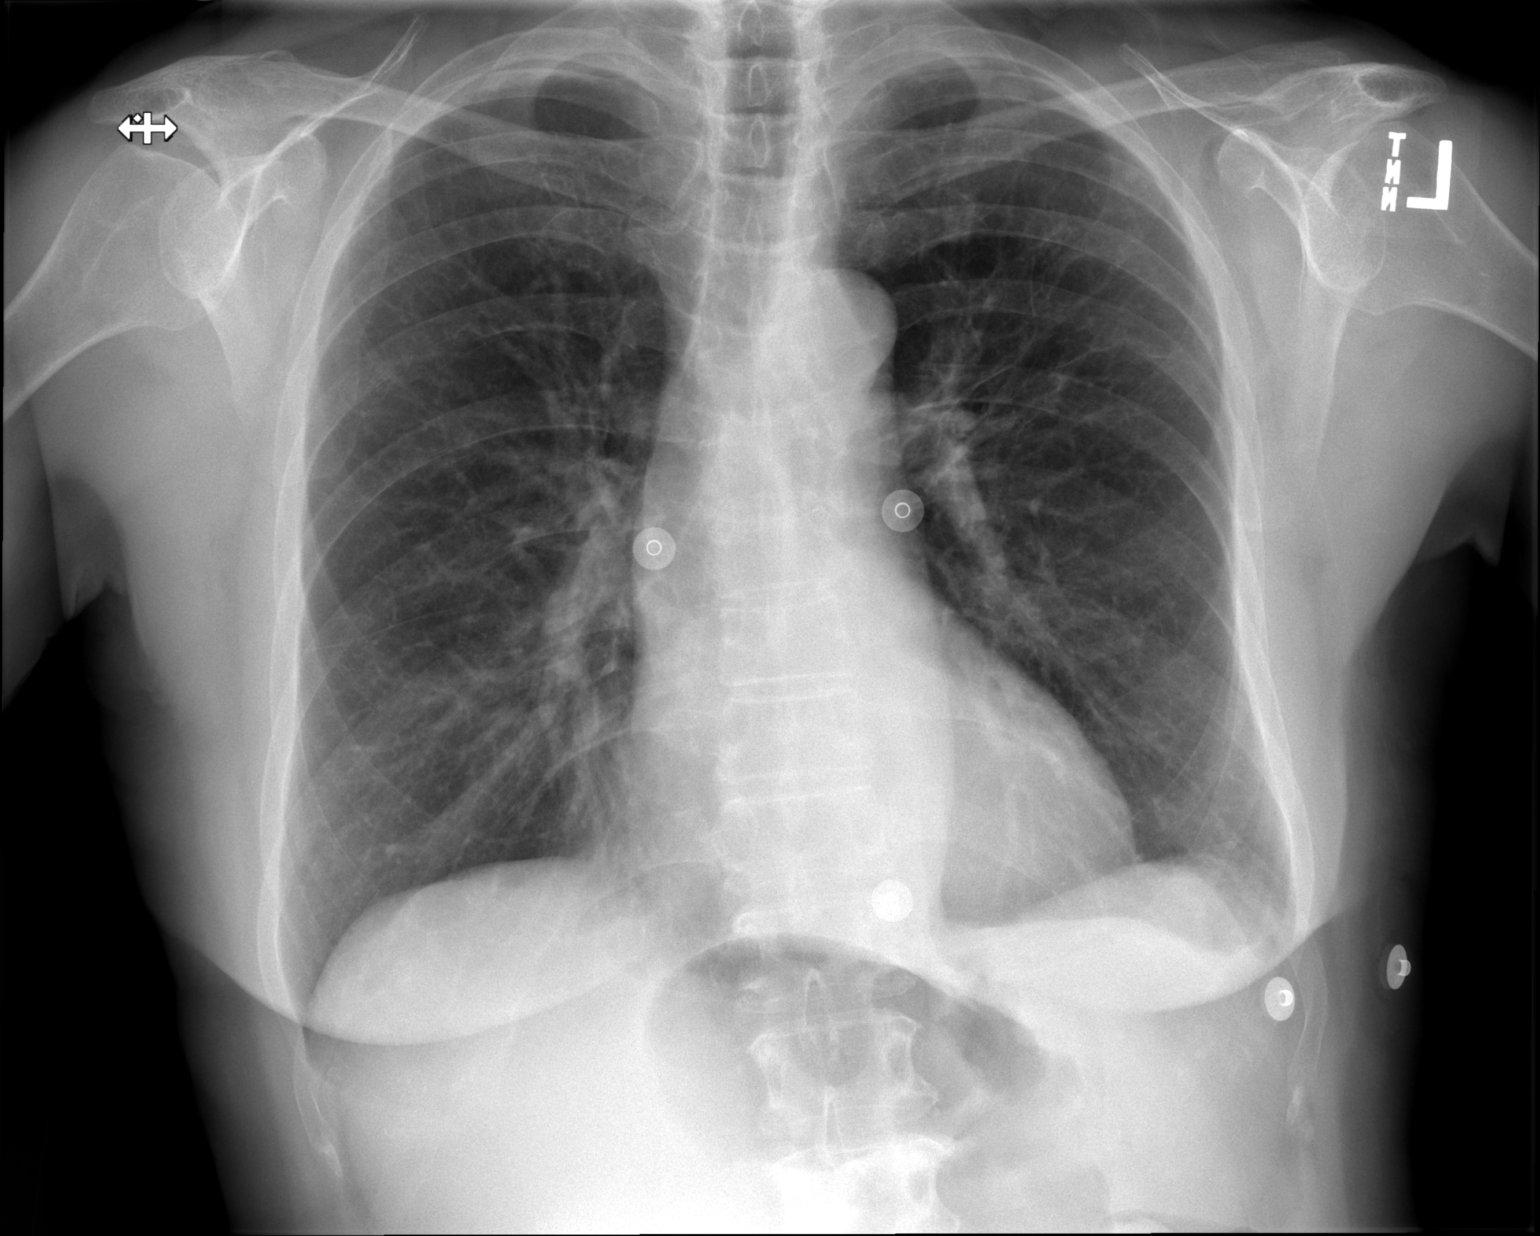

[w chest lat]
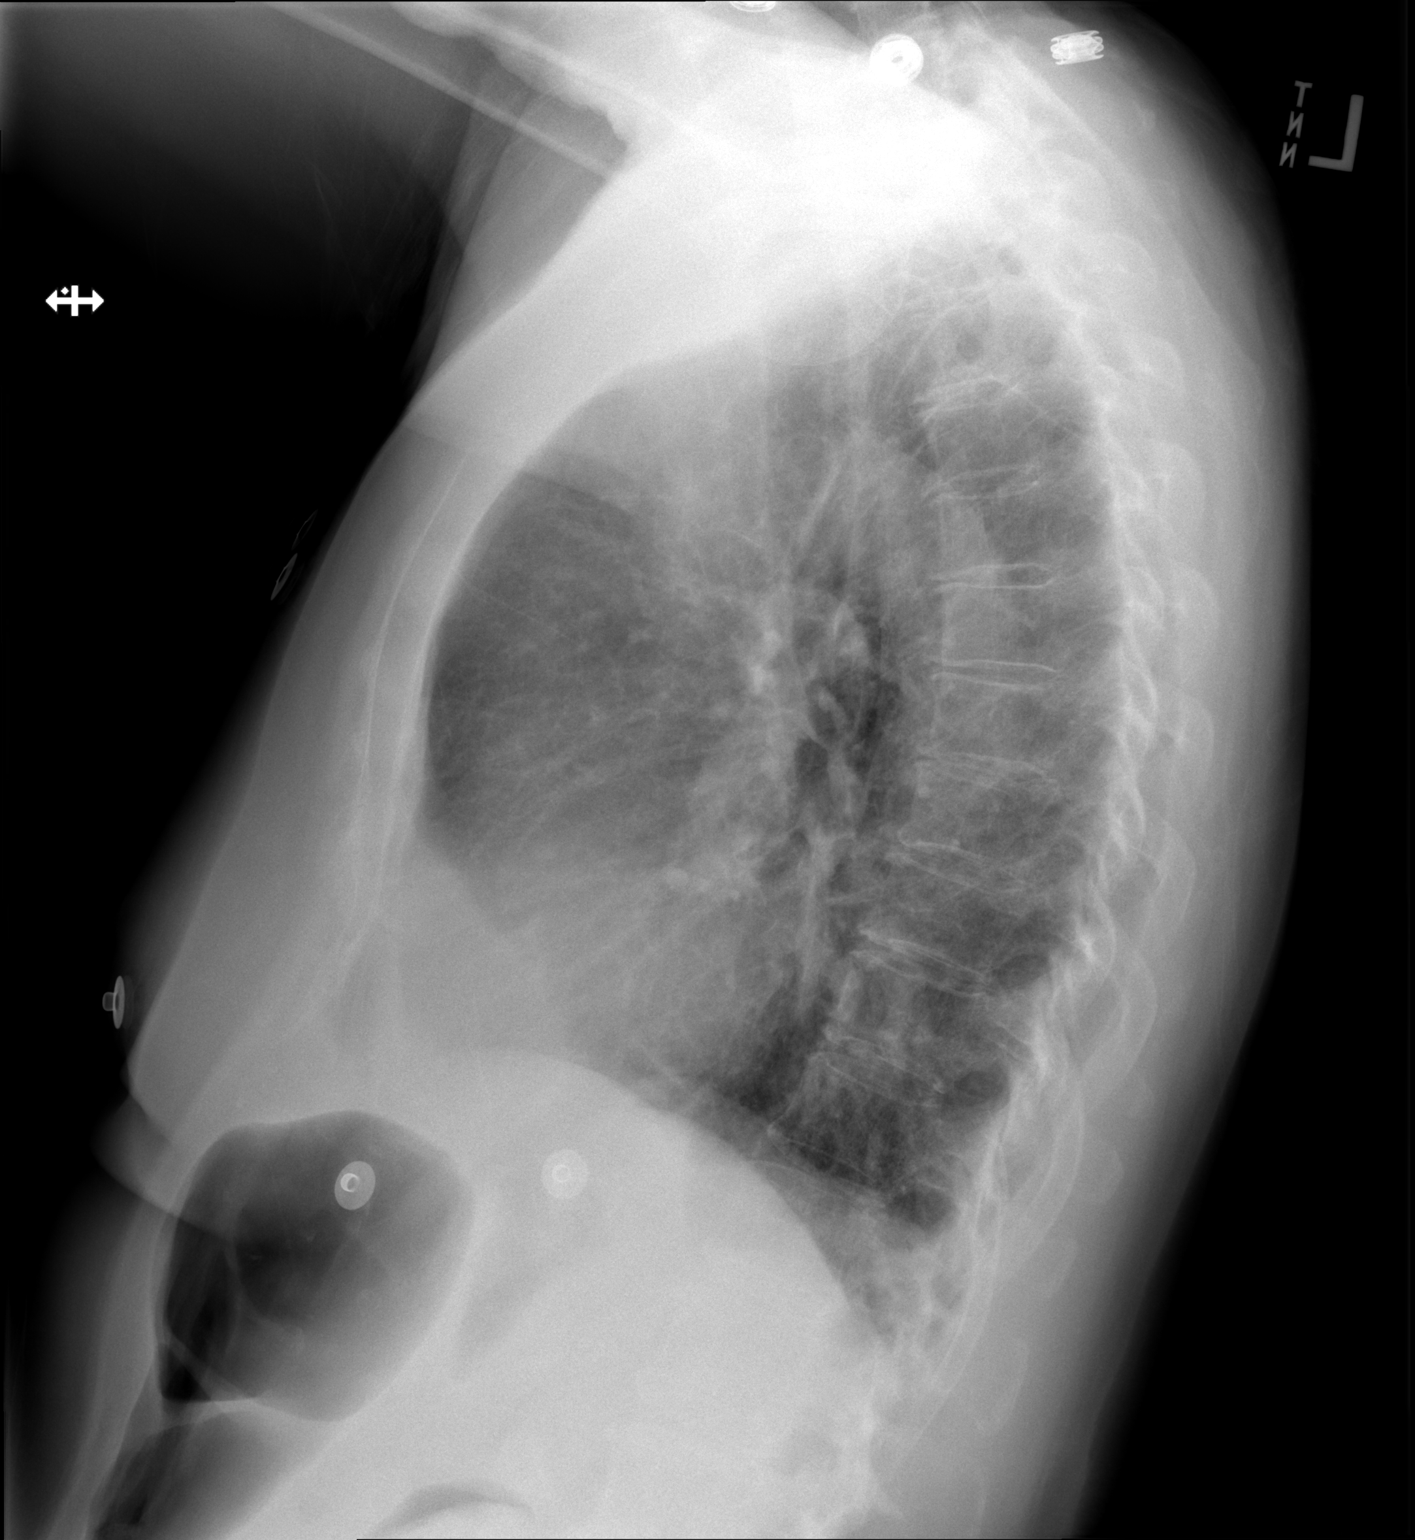

[2 of 2 positions shown; findings below may reference images not displayed]

FINDINGS: Heart and mediastinal contours are within normal limits. No focal
opacities or effusions. No acute bony abnormality.
IMPRESSION: No active cardiopulmonary disease.

## 2017-12-07 IMAGING — MR MR MRA HEAD W/O CM
9 of 10 series · 33 of 48 positions shown · non-contrast
Comparison: Head CT from earlier today

CLINICAL DATA: Right-sided weakness for 3 days with confusion.

EXAM:
MRI HEAD WITHOUT CONTRAST
MRA HEAD WITHOUT CONTRAST
TECHNIQUE: Multiplanar, multiecho pulse sequences of the brain and surrounding
structures were obtained without intravenous contrast. Angiographic
images of the head were obtained using MRA technique without
contrast.

[Series 2: [id number]_sag · sagittal · 5.0mm · 0.40mm/px · 3 of 20 slices shown]
[im 1/20]
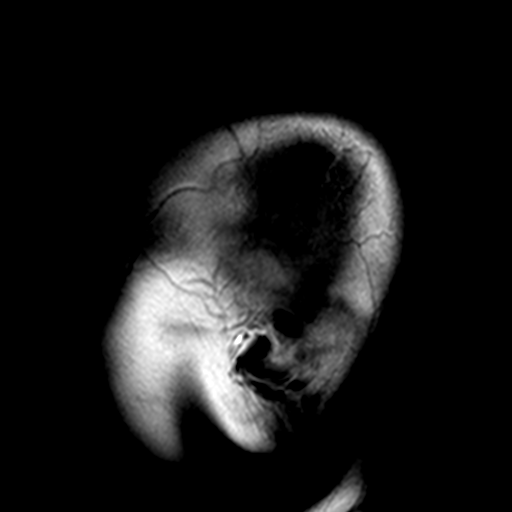
[im 10/20]
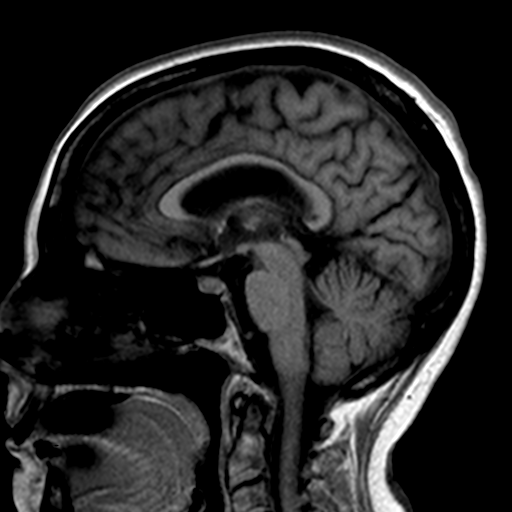
[im 20/20]
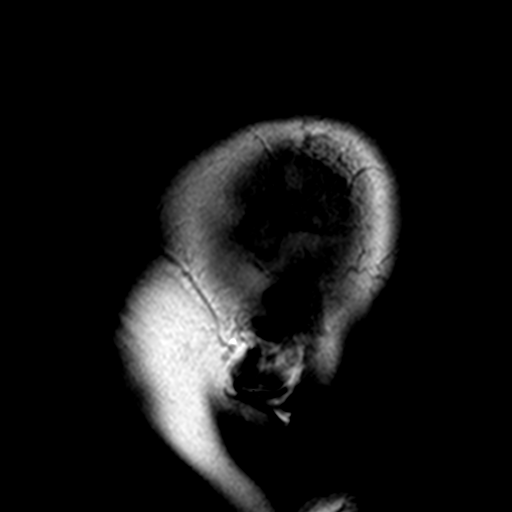

[Series 3: MRA · axial · 0.8mm · 0.38mm/px · 1 of 131 slices shown]
[im 1/131]
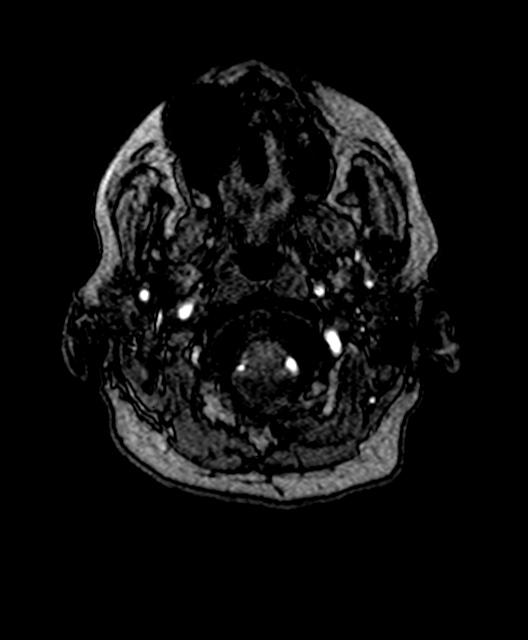

[Series 8: DWI · axial · 3.0mm · 0.71mm/px · z∈[-68,+94]mm · 6 of 55 slices shown (1 of 4)]
[im 1/55]
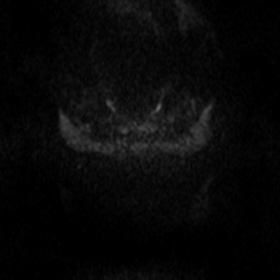
[im 11/55]
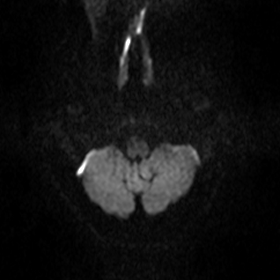
[im 22/55]
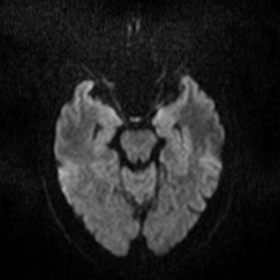
[im 33/55]
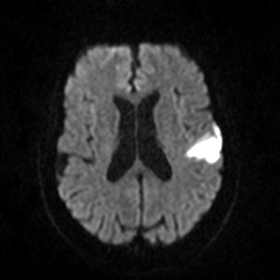
[im 44/55]
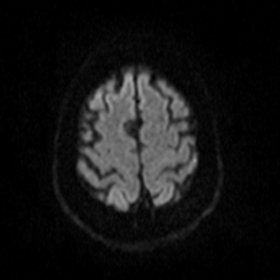
[im 55/55]
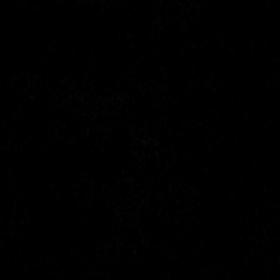

[Series 9: DWI · axial · 3.0mm · 0.82mm/px · z∈[-68,+91]mm · 5 of 54 slices shown (2 of 4)]
[im 1/54]
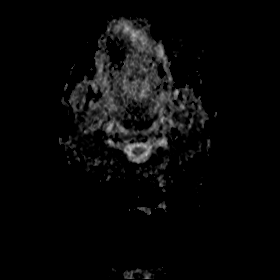
[im 14/54]
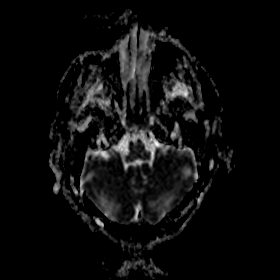
[im 27/54]
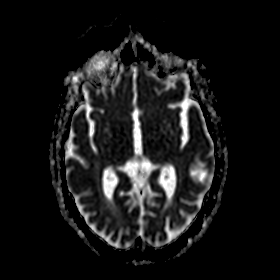
[im 40/54]
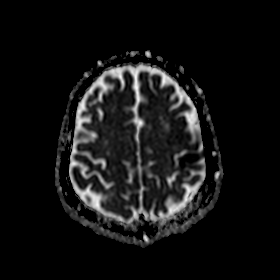
[im 54/54]
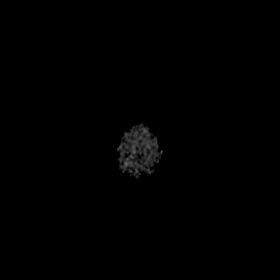

[Series 10: DWI · coronal · 5.0mm · 0.49mm/px · 3 of 34 slices shown (3 of 4)]
[im 1/34]
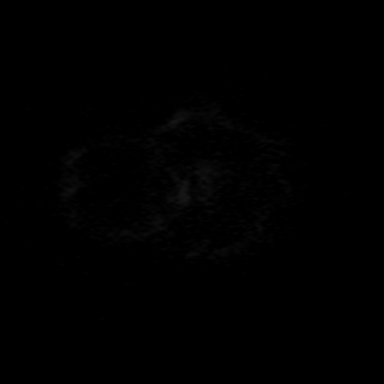
[im 17/34]
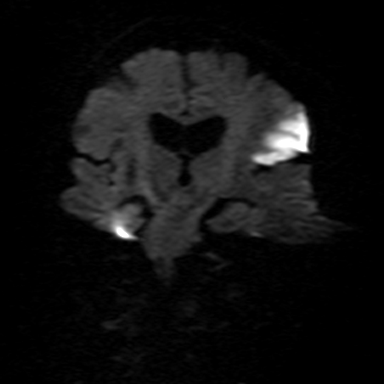
[im 34/34]
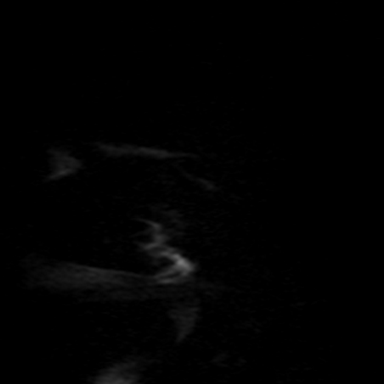

[Series 11: DWI · coronal · 5.0mm · 0.50mm/px · 3 of 34 slices shown (4 of 4)]
[im 1/34]
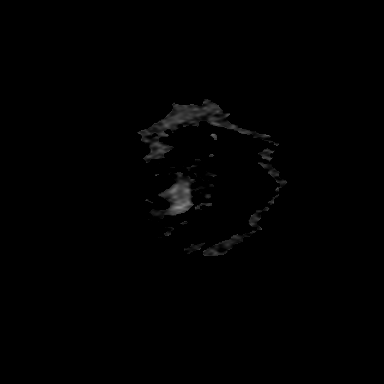
[im 17/34]
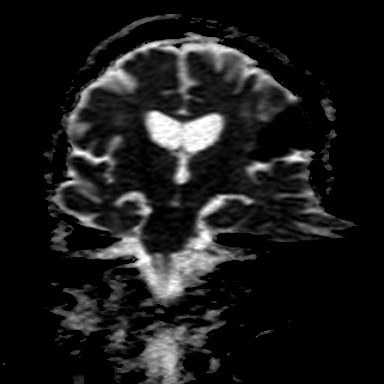
[im 34/34]
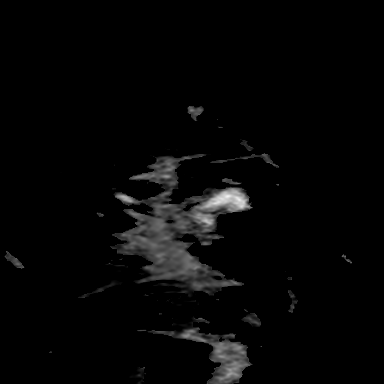

[Series 12: T2 · axial · 5.0mm · 0.66mm/px · z∈[-59,+84]mm · 2 of 23 slices shown]
[im 1/23]
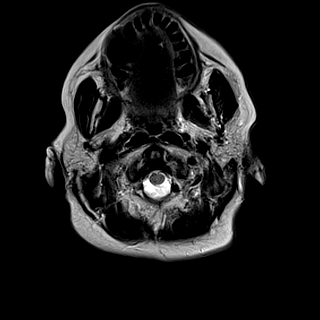
[im 23/23]
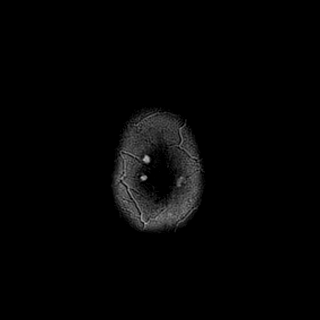

[Series 13: FLAIR · axial · 5.0mm · 0.81mm/px · z∈[-59,+84]mm · 2 of 23 slices shown]
[im 1/23]
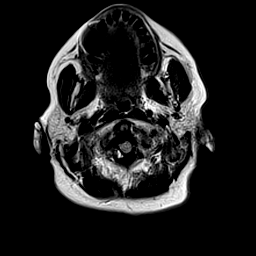
[im 23/23]
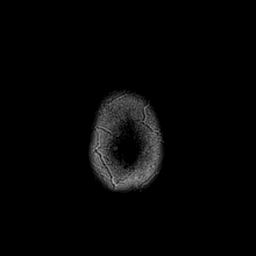

[Series 14: T1 · axial · 2.0mm · 0.43mm/px · z∈[-76,+88]mm · 8 of 83 slices shown]
[im 1/83]
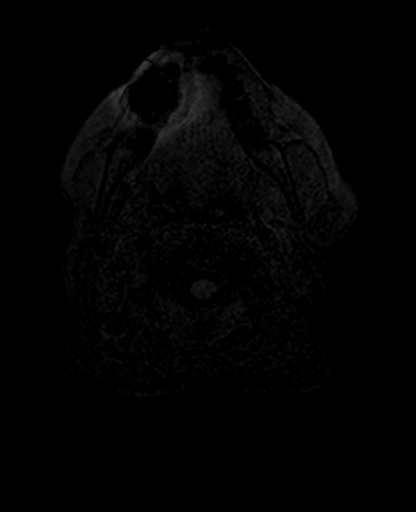
[im 12/83]
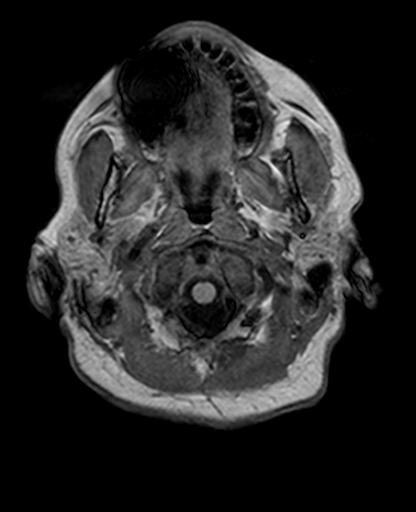
[im 24/83]
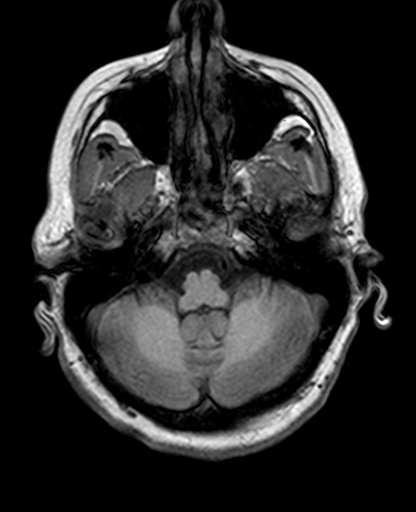
[im 36/83]
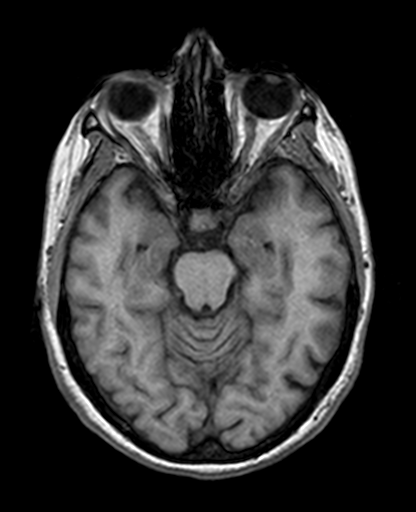
[im 47/83]
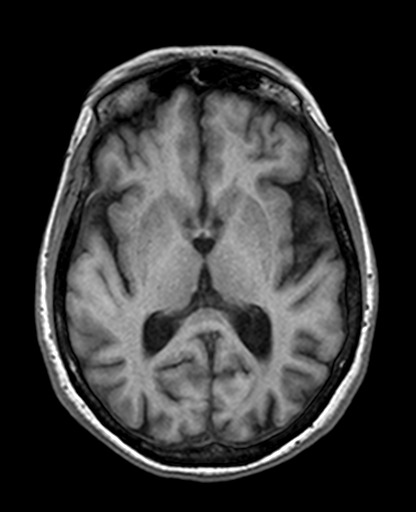
[im 59/83]
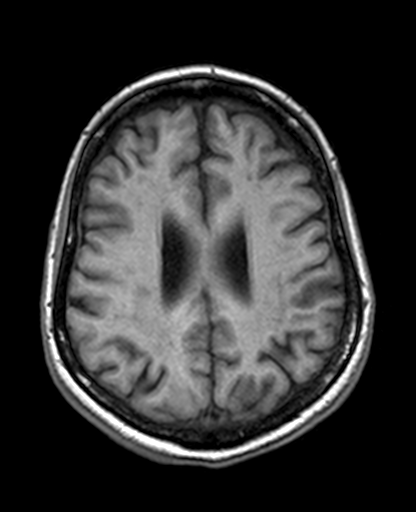
[im 71/83]
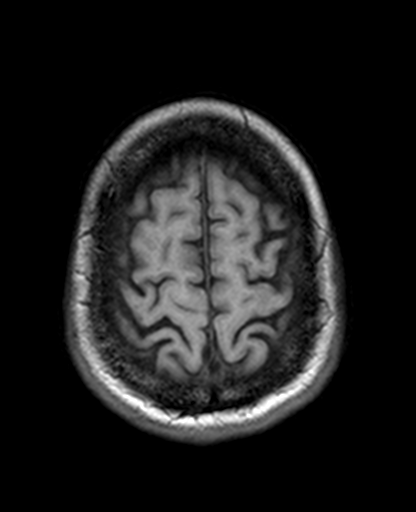
[im 83/83]
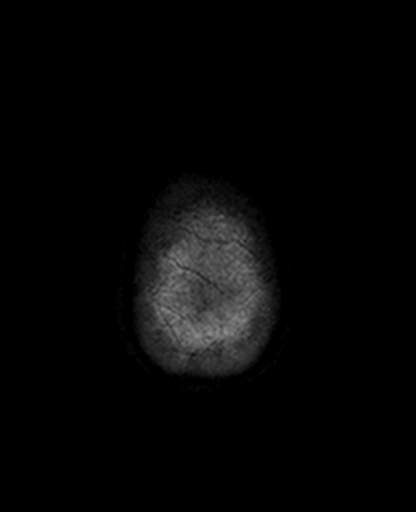

[33 of 48 positions shown; findings below may reference images not displayed]

FINDINGS: MRI HEAD FINDINGS

Brain: Moderate area of cortically based restricted diffusion in the
posterior left frontal cortex. The infarct involves the lower
perirolandic cortex. No superimposed hemorrhage. Hydrocephalus,
mass, or shift.

Chronic patchy FLAIR hyperintensity in the cerebral white matter,
greatest around the lateral ventricles. Tiny remote infarct in the
superior left cerebellum.

Vascular: Arterial findings below. Unremarkable dural venous sinus
appearance.

Skull and upper cervical spine: Normal marrow signal.

Sinuses/Orbits: Negative.

MRA HEAD FINDINGS

Symmetric carotid arteries. Dominant left vertebral artery with
right vertebral artery ending in pica. Left M 2-3 branch occlusion
correlating with acute infarct. No more proximal flow limiting left
MCA stenosis. There is mild atheromatous type irregularity and
narrowing of distal right M1 and bilateral proximal PCA segments.
This appearance is likely accentuated by downward direction of flow
these vessels.

Superiorly directed left ophthalmic segment aneurysm measuring 3 mm.
IMPRESSION: 1. Left M2-3 branch occlusion with acute, nonhemorrhagic infarct.
2. Chronic microvascular disease.
3. 3 mm left ophthalmic segment ICA aneurysm.

## 2017-12-24 ENCOUNTER — Emergency Department (HOSPITAL_COMMUNITY): Payer: Medicare Other

## 2017-12-24 ENCOUNTER — Other Ambulatory Visit: Payer: Self-pay

## 2017-12-24 ENCOUNTER — Other Ambulatory Visit (HOSPITAL_COMMUNITY)
Admission: RE | Admit: 2017-12-24 | Discharge: 2017-12-24 | Disposition: A | Discharge status: Home / Self Care | Payer: Medicare Other | Source: Ambulatory Visit | Attending: Neurology | Admitting: Neurology

## 2017-12-24 ENCOUNTER — Emergency Department (HOSPITAL_COMMUNITY)
Admission: EM | Admit: 2017-12-24 | Discharge: 2017-12-24 | Disposition: A | Discharge status: Home / Self Care | Payer: Medicare Other | Attending: Emergency Medicine | Admitting: Emergency Medicine

## 2017-12-24 ENCOUNTER — Encounter (HOSPITAL_COMMUNITY): Payer: Self-pay | Admitting: Emergency Medicine

## 2017-12-24 DIAGNOSIS — Z87891 Personal history of nicotine dependence: Secondary | ICD-10-CM | POA: Insufficient documentation

## 2017-12-24 DIAGNOSIS — Z7982 Long term (current) use of aspirin: Secondary | ICD-10-CM | POA: Diagnosis not present

## 2017-12-24 DIAGNOSIS — Z8673 Personal history of transient ischemic attack (TIA), and cerebral infarction without residual deficits: Secondary | ICD-10-CM | POA: Insufficient documentation

## 2017-12-24 DIAGNOSIS — Z79899 Other long term (current) drug therapy: Secondary | ICD-10-CM | POA: Insufficient documentation

## 2017-12-24 DIAGNOSIS — Z91040 Latex allergy status: Secondary | ICD-10-CM | POA: Insufficient documentation

## 2017-12-24 DIAGNOSIS — E876 Hypokalemia: Secondary | ICD-10-CM | POA: Insufficient documentation

## 2017-12-24 DIAGNOSIS — I10 Essential (primary) hypertension: Secondary | ICD-10-CM | POA: Diagnosis not present

## 2017-12-24 HISTORY — DX: Cerebral infarction, unspecified: I63.9

## 2017-12-24 LAB — COMPREHENSIVE METABOLIC PANEL
ALT: 11 U/L (ref 0–44)
AST: 19 U/L (ref 15–41)
Albumin: 3.7 g/dL (ref 3.5–5.0)
Alkaline Phosphatase: 50 U/L (ref 38–126)
Anion gap: 7 (ref 5–15)
BILIRUBIN TOTAL: 0.5 mg/dL (ref 0.3–1.2)
BUN: 13 mg/dL (ref 8–23)
CO2: 33 mmol/L — ABNORMAL HIGH (ref 22–32)
Calcium: 8.8 mg/dL — ABNORMAL LOW (ref 8.9–10.3)
Chloride: 101 mmol/L (ref 98–111)
Creatinine, Ser: 0.85 mg/dL (ref 0.44–1.00)
GFR calc Af Amer: >60 mL/min (ref >60)
Glucose, Bld: 107 mg/dL — ABNORMAL HIGH (ref 70–99)
POTASSIUM: 2.6 mmol/L — AB (ref 3.5–5.1)
Sodium: 141 mmol/L (ref 135–145)
TOTAL PROTEIN: 6.6 g/dL (ref 6.5–8.1)

## 2017-12-24 LAB — CBC WITH DIFFERENTIAL/PLATELET
BASOS PCT: 0 %
Basophils Absolute: 0 10*3/uL (ref 0.0–0.1)
EOS ABS: 0 10*3/uL (ref 0.0–0.7)
Eosinophils Relative: 0 %
HCT: 32.8 % — ABNORMAL LOW (ref 36.0–46.0)
HEMOGLOBIN: 10.6 g/dL — AB (ref 12.0–15.0)
Lymphocytes Relative: 16 %
Lymphs Abs: 1.2 10*3/uL (ref 0.7–4.0)
MCH: 29 pg (ref 26.0–34.0)
MCHC: 32.3 g/dL (ref 30.0–36.0)
MCV: 89.6 fL (ref 78.0–100.0)
Monocytes Absolute: 0.6 10*3/uL (ref 0.1–1.0)
Monocytes Relative: 8 %
NEUTROS PCT: 76 %
Neutro Abs: 5.9 10*3/uL (ref 1.7–7.7)
Platelets: 177 10*3/uL (ref 150–400)
RBC: 3.66 MIL/uL — ABNORMAL LOW (ref 3.87–5.11)
RDW: 14.3 % (ref 11.5–15.5)
WBC: 7.8 10*3/uL (ref 4.0–10.5)

## 2017-12-24 LAB — I-STAT CHEM 8, ED
BUN: 13 mg/dL (ref 8–23)
CALCIUM ION: 1.12 mmol/L — AB (ref 1.15–1.40)
CHLORIDE: 98 mmol/L (ref 98–111)
Creatinine, Ser: 0.9 mg/dL (ref 0.44–1.00)
Glucose, Bld: 122 mg/dL — ABNORMAL HIGH (ref 70–99)
HCT: 30 % — ABNORMAL LOW (ref 36.0–46.0)
Hemoglobin: 10.2 g/dL — ABNORMAL LOW (ref 12.0–15.0)
Potassium: 2.9 mmol/L — ABNORMAL LOW (ref 3.5–5.1)
SODIUM: 142 mmol/L (ref 135–145)
TCO2: 30 mmol/L (ref 22–32)

## 2017-12-24 LAB — LIPID PANEL
CHOLESTEROL: 147 mg/dL (ref 0–200)
HDL: 34 mg/dL — ABNORMAL LOW (ref >40)
LDL Cholesterol: 79 mg/dL (ref 0–99)
Total CHOL/HDL Ratio: 4.3 RATIO
Triglycerides: 172 mg/dL — ABNORMAL HIGH (ref <150)
VLDL: 34 mg/dL (ref 0–40)

## 2017-12-24 LAB — URINALYSIS, ROUTINE W REFLEX MICROSCOPIC
BILIRUBIN URINE: NEGATIVE
Bacteria, UA: NONE SEEN
Glucose, UA: NEGATIVE mg/dL
Ketones, ur: NEGATIVE mg/dL
Leukocytes, UA: NEGATIVE
Nitrite: NEGATIVE
PH: 9 — AB (ref 5.0–8.0)
Protein, ur: NEGATIVE mg/dL
Specific Gravity, Urine: 1.006 (ref 1.005–1.030)

## 2017-12-24 LAB — MAGNESIUM: MAGNESIUM: 2.3 mg/dL (ref 1.7–2.4)

## 2017-12-24 LAB — HEMOGLOBIN A1C
Hgb A1c MFr Bld: 5.7 % — ABNORMAL HIGH (ref 4.8–5.6)
Mean Plasma Glucose: 116.89 mg/dL

## 2017-12-24 LAB — VITAMIN B12: VITAMIN B 12: 313 pg/mL (ref 180–914)

## 2017-12-24 LAB — TSH: TSH: 2.945 u[IU]/mL (ref 0.350–4.500)

## 2017-12-24 MED ORDER — POTASSIUM CHLORIDE ER 10 MEQ PO CPCR: 20.0000 meq | ORAL_CAPSULE | Freq: Two times a day (BID) | ORAL | 0 refills | Status: AC

## 2017-12-24 MED ORDER — POTASSIUM CHLORIDE CRYS ER 20 MEQ PO TBCR
40.0000 meq | EXTENDED_RELEASE_TABLET | Freq: Once | ORAL | Status: AC
  Administered 2017-12-24: 40 meq via ORAL
  Filled 2017-12-24: qty 2

## 2017-12-24 MED ORDER — FLUCONAZOLE 150 MG PO TABS: 150.0000 mg | ORAL_TABLET | Freq: Once | ORAL | 0 refills | Status: AC

## 2017-12-24 MED ORDER — POTASSIUM CHLORIDE 10 MEQ/100ML IV SOLN
10.0000 meq | Freq: Once | INTRAVENOUS | Status: AC
  Administered 2017-12-24: 10 meq via INTRAVENOUS
  Filled 2017-12-24: qty 100

## 2017-12-24 NOTE — ED Provider Notes (Signed)
[facility] EMERGENCY DEPARTMENT Provider Note   CSN: [id number] Arrival date & time: 12/24/17  1608     History   Chief Complaint Chief Complaint  Patient presents with  . Abnormal Lab    HPI Patricia Norman is a 67 y.o. female.  HPI Patient sent in for potassium of 2.6.  Drawn by neurologist today.  States she is felt a little dizzy.  Has had more swelling on her legs.  She is supposed be on potassium supplementation but has not been taking it as she is prescribed.  Has chronic shortness of breath.  On chronic oxygen.  States she felt a little dizzy when she stands.  She is on hydrochlorothiazide. Past Medical History:  Diagnosis Date  . Chest pain   . Depression   . History of constipation   . Hyperlipidemia   . Hypertension   . Migraine headache   . Stroke (HCC) 2017  . Tobacco abuse     Patient Active Problem List   Diagnosis Date Noted  . CVA (cerebral vascular accident) (HCC) 03/31/2016  . Cerebrovascular accident (CVA) (HCC) 03/31/2016  . HTN (hypertension) 03/31/2016  . Left knee pain 08/08/2015  . Hypokalemia 10/03/2013  . Sicca syndrome (HCC) 02/21/2011  . Tobacco abuse counseling 02/21/2011  . Benign hypertensive heart disease without heart failure 02/21/2011  . Dyslipidemia 02/21/2011  . ASTHMATIC BRONCHITIS, ACUTE 09/09/2007  . DYSPNEA 09/09/2007  . CHEST PAIN UNSPECIFIED 09/09/2007    Past Surgical History:  Procedure Laterality Date  . CARDIOVASCULAR STRESS TEST  08/10/2009   EF 67%  . CHOLECYSTECTOMY    . COLONOSCOPY     DR. Dorena Cookey  . PARTIAL HYSTERECTOMY    . US ECHOCARDIOGRAPHY  12/15/2003   EF 55-60%     OB History   None      Home Medications    Prior to Admission medications   Medication Sig Start Date End Date Taking? Authorizing Provider  albuterol (PROVENTIL) (2.5 MG/3ML) 0.083% nebulizer solution Inhale 3 mL (2.5 mg total) by nebulization every four (4) hours as needed for wheezing. 05/13/17 05/13/18 Yes [provider]  ALPRAZolam Prudy Feeler) 0.5 MG tablet Take 0.5 mg by mouth 2 (two) times daily as needed for anxiety.   Yes [provider]  amLODipine (NORVASC) 5 MG tablet Take 1 tablet (5 mg total) by mouth daily. Patient taking differently: Take 5 mg by mouth every evening.  08/08/15  Yes Cassell Clement, MD  aspirin 325 MG tablet Take 1 tablet (325 mg total) by mouth daily. Patient taking differently: Take 325 mg by mouth every evening.  04/02/16  Yes Erick Blinks, MD  aspirin-acetaminophen-caffeine (EXCEDRIN MIGRAINE) [phone] MG per tablet Take 2 tablets by mouth daily as needed for headache or migraine.    Yes [provider]  cyclobenzaprine (FLEXERIL) 10 MG tablet TAKE 1 TABLET BY MOUTH 3 TIMES A DAY AS NEEDED FOR MUSCLE SPASMS 08/02/15  Yes Cassell Clement, MD  famciclovir [facility]) 500 MG tablet Take 500 mg by mouth every evening.  07/30/15  Yes [provider]  fluticasone (FLONASE) 50 MCG/ACT nasal spray Place 2 sprays into both nostrils every evening.    Yes [provider]  hydrochlorothiazide (MICROZIDE) 12.5 MG capsule TAKE ONE CAPSULE BY MOUTH DAILY Patient taking differently: TAKE ONE CAPSULE BY MOUTH DAILY IN THE EVENING 08/02/15  Yes Cassell Clement, MD  loratadine (CLARITIN) 10 MG tablet Take 10 mg by mouth every evening.    Yes [provider]  Melatonin  5 MG TABS Take 5 mg by mouth every evening.    Yes [provider]  metoprolol succinate (TOPROL-XL) 25 MG 24 hr tablet Take 1 tablet (25 mg total) by mouth daily. 08/07/14  Yes Cassell ClementBrackbill, Thomas, MD  mirtazapine (REMERON) 30 MG tablet  10/19/17  Yes [provider]  omeprazole (PRILOSEC) 20 MG capsule Take 1 capsule (20 mg total) by mouth daily. Patient taking differently: Take 20 mg by mouth every evening.  08/07/14  Yes Cassell ClementBrackbill, Thomas, MD  oxybutynin (DITROPAN-XL) 10 MG 24 hr tablet Take 10 mg by mouth at bedtime.    Yes [provider]  pravastatin  (PRAVACHOL) 80 MG tablet TAKE ONE TABLET BY MOUTH DAILY 10/23/17  Yes [provider]  sertraline (ZOLOFT) 100 MG tablet Take 1.5 tablets (150 mg total) by mouth daily. Patient taking differently: Take 100 mg by mouth at bedtime.  08/07/14  Yes Cassell ClementBrackbill, Thomas, MD  simvastatin (ZOCOR) 40 MG tablet Take 1 tablet (40 mg total) by mouth daily at 6 PM. 04/01/16  Yes Erick BlinksMemon, Jehanzeb, MD  traZODone (DESYREL) 50 MG tablet Take 1 tablet (50 mg total) by mouth daily. Patient taking differently: Take 50 mg by mouth at bedtime.  10/11/13  Yes Cassell ClementBrackbill, Thomas, MD  valACYclovir (VALTREX) 500 MG tablet Take by mouth. 11/11/17  Yes [provider]  ALPRAZolam Prudy Feeler(XANAX) 1 MG tablet Take 1 mg by mouth 2 (two) times daily. 11/27/17   [provider]  fluconazole (DIFLUCAN) 150 MG tablet Take 1 tablet (150 mg total) by mouth once for 1 dose. 12/24/17 12/24/17  Benjiman CorePickering, Maryan Sivak, MD  OXYGEN Inhale 2 L into the lungs.    [provider]  potassium chloride (MICRO-K) 10 MEQ CR capsule Take 2 capsules (20 mEq total) by mouth 2 (two) times daily. 12/24/17   Benjiman CorePickering, Bannie Lobban, MD    Family History Family History  Problem Relation Age of Onset  . Hypertension Mother   . Angina Father     Social History Social History   Tobacco Use  . Smoking status: Former Smoker    Packs/day: 0.50  . Smokeless tobacco: Never Used  Substance Use Topics  . Alcohol use: No  . Drug use: No     Allergies   Varenicline; Ace inhibitors; Chantix [varenicline tartrate]; Codeine; Spironolactone; and Latex   Review of Systems Review of Systems  Constitutional: Negative for fever.  HENT: Negative for congestion.   Respiratory: Positive for shortness of breath.   Cardiovascular: Positive for leg swelling. Negative for chest pain.  Gastrointestinal: Negative for abdominal pain.  Genitourinary: Negative for flank pain.  Musculoskeletal: Negative for back pain.  Skin: Negative for wound.    Neurological: Positive for light-headedness.  Psychiatric/Behavioral: Negative for confusion.     Physical Exam Updated Vital Signs BP (!) 141/80   Pulse 80   Temp 99 F (37.2 C) (Temporal)   Resp (!) 21   Ht 4\' 10"  (1.473 m)   Wt 72.1 kg (159 lb)   SpO2 100%   BMI 33.23 kg/m   Physical Exam  Constitutional: She appears well-developed.  HENT:  Head: Atraumatic.  Patient is wearing nasal cannula oxygen  Eyes: EOM are normal.  Neck: Neck supple.  Cardiovascular: Normal rate.  Musculoskeletal: She exhibits edema.  Pitting edema bilateral lower extremities.  Neurological: She is alert.  Skin: Skin is warm.     ED Treatments / Results  Labs (all labs ordered are listed, but only abnormal results are displayed) Labs Reviewed  URINALYSIS, ROUTINE W REFLEX MICROSCOPIC - Abnormal; Notable for the following components:      Result Value   Color, Urine STRAW (*)    pH 9.0 (*)    Hgb urine dipstick SMALL (*)    All other components within normal limits  I-STAT CHEM 8, ED - Abnormal; Notable for the following components:   Potassium 2.9 (*)    Glucose, Bld 122 (*)    Calcium, Ion 1.12 (*)    Hemoglobin 10.2 (*)    HCT 30.0 (*)    All other components within normal limits  MAGNESIUM    EKG None  Radiology Dg Chest 2 View  Result Date: 12/24/2017 CLINICAL DATA:  Chronic shortness of breath with decreased potassium level EXAM: CHEST - 2 VIEW COMPARISON:  March 31, 2016 FINDINGS: The heart size and mediastinal contours are within normal limits. Both lungs are clear. Scoliosis of the spine is identified. IMPRESSION: No active cardiopulmonary disease. Electronically Signed   By: Sherian Rein M.D.   On: 12/24/2017 17:28    Procedures Procedures (including critical care time)  Medications Ordered in ED Medications  potassium chloride 10 mEq in 100 mL IVPB (0 mEq Intravenous Stopped 12/24/17 1905)  potassium chloride SA (K-DUR,KLOR-CON) CR tablet 40 mEq (40 mEq Oral  Given 12/24/17 1830)     Initial Impression / Assessment and Plan / ED Course  I have reviewed the triage vital signs and the nursing notes.  Pertinent labs & imaging results that were available during my care of the patient were reviewed by me and considered in my medical decision making (see chart for details).     Patient presents for hypokalemia.  Recheck of 2.9 here and 2.6 as an outpatient.  Has had 10 mEq IV and 40 orally.  Magnesium normal.  Will discharge home with potassium and will need to follow-up as an outpatient.  Patient later stated that she has had some worry that she has a vaginal yeast infection.  Had recently been on antibiotics.  Will empirically treat with a single dose of Diflucan.  Final Clinical Impressions(s) / ED Diagnoses   Final diagnoses:  Hypokalemia    ED Discharge Orders        Ordered    potassium chloride (MICRO-K) 10 MEQ CR capsule  2 times daily     12/24/17 1906    fluconazole (DIFLUCAN) 150 MG tablet   Once     12/24/17 1916       Benjiman Core, MD 12/24/17 Ernestina [place]

## 2017-12-24 NOTE — Discharge Instructions (Signed)
Follow-up with your doctor for further management of your low potassium.  You also need to follow with your cardiologist for the extra swelling on her legs.

## 2017-12-24 NOTE — ED Triage Notes (Signed)
Told by pcp to come in for potassium level of  2.6.

## 2017-12-24 NOTE — ED Notes (Signed)
ED Provider at bedside. 

## 2017-12-25 LAB — VITAMIN D 25 HYDROXY (VIT D DEFICIENCY, FRACTURES): Vit D, 25-Hydroxy: 22.1 ng/mL — ABNORMAL LOW (ref 30.0–100.0)

## 2018-02-10 ENCOUNTER — Encounter (HOSPITAL_COMMUNITY): Payer: Self-pay | Admitting: Physical Therapy

## 2018-02-10 NOTE — Therapy (Signed)
[place], [place], [address] Phone: [phone]   Fax:  [phone]  Patient Details  Name: Patricia Norman MRN: [id number] Date of Birth: Aug 20, 1950 Referring Provider:  No ref. provider found  Encounter Date: 02/10/2018  PHYSICAL THERAPY DISCHARGE SUMMARY  Visits from Start of Care: 3  Current functional level related to goals / functional outcomes: unknown   Remaining deficits: unknown   Education / Equipment: HEP Plan: Patient agrees to discharge.  Patient goals were partially met. Patient is being discharged due to not returning since the last visit.  ?????       Rayetta Humphrey, PT CLT [phone] 02/10/2018, 4:16 PM  [facility] [address] [place], [place], [address] Phone: [phone]   Fax:  [phone]

## 2020-08-10 ENCOUNTER — Other Ambulatory Visit: Payer: Self-pay | Admitting: Internal Medicine
